# Patient Record
Sex: Male | Born: 1965 | Race: Black or African American | Hispanic: No | Smoking: Never smoker
Health system: Southern US, Community
[De-identification: ages and names within clinical notes are randomized; demographics above are authoritative.]

## PROBLEM LIST (undated history)

## (undated) DIAGNOSIS — Z789 Other specified health status: Secondary | ICD-10-CM

## (undated) HISTORY — DX: Other specified health status: Z78.9

## (undated) HISTORY — PX: NO PAST SURGERIES: SHX2092

## (undated) HISTORY — PX: OTHER SURGICAL HISTORY: SHX169

---

## 2003-01-24 ENCOUNTER — Encounter: Payer: Self-pay | Admitting: Emergency Medicine

## 2003-01-24 ENCOUNTER — Emergency Department (HOSPITAL_COMMUNITY): Admission: EM | Admit: 2003-01-24 | Discharge: 2003-01-24 | Payer: Self-pay | Admitting: Emergency Medicine

## 2003-11-03 ENCOUNTER — Emergency Department (HOSPITAL_COMMUNITY): Admission: EM | Admit: 2003-11-03 | Discharge: 2003-11-03 | Payer: Self-pay | Admitting: Emergency Medicine

## 2003-11-10 ENCOUNTER — Encounter: Admission: RE | Admit: 2003-11-10 | Discharge: 2003-11-10 | Payer: Self-pay | Admitting: Ophthalmology

## 2004-04-03 ENCOUNTER — Emergency Department (HOSPITAL_COMMUNITY): Admission: EM | Admit: 2004-04-03 | Discharge: 2004-04-04 | Payer: Self-pay | Admitting: Emergency Medicine

## 2005-09-13 ENCOUNTER — Emergency Department (HOSPITAL_COMMUNITY): Admission: EM | Admit: 2005-09-13 | Discharge: 2005-09-13 | Payer: Self-pay | Admitting: Emergency Medicine

## 2006-11-20 ENCOUNTER — Encounter (INDEPENDENT_AMBULATORY_CARE_PROVIDER_SITE_OTHER): Payer: Self-pay | Admitting: Family Medicine

## 2007-01-03 ENCOUNTER — Encounter (INDEPENDENT_AMBULATORY_CARE_PROVIDER_SITE_OTHER): Payer: Self-pay | Admitting: *Deleted

## 2007-01-03 ENCOUNTER — Ambulatory Visit: Payer: Self-pay | Admitting: Family Medicine

## 2007-01-03 DIAGNOSIS — R944 Abnormal results of kidney function studies: Secondary | ICD-10-CM | POA: Insufficient documentation

## 2007-01-03 LAB — CONVERTED CEMR LAB
Bilirubin Urine: NEGATIVE
Ketones, urine, test strip: NEGATIVE
Nitrite: NEGATIVE
Protein, U semiquant: NEGATIVE
Specific Gravity, Urine: 1.02
WBC Urine, dipstick: NEGATIVE

## 2007-01-07 ENCOUNTER — Telehealth (INDEPENDENT_AMBULATORY_CARE_PROVIDER_SITE_OTHER): Payer: Self-pay | Admitting: Family Medicine

## 2007-01-07 LAB — CONVERTED CEMR LAB
BUN: 12 mg/dL (ref 6–23)
Creatinine, Ser: 1.47 mg/dL (ref 0.40–1.50)
PSA: 0.59 ng/mL (ref 0.10–4.00)

## 2007-01-08 ENCOUNTER — Encounter (INDEPENDENT_AMBULATORY_CARE_PROVIDER_SITE_OTHER): Payer: Self-pay | Admitting: *Deleted

## 2007-01-10 ENCOUNTER — Telehealth (INDEPENDENT_AMBULATORY_CARE_PROVIDER_SITE_OTHER): Payer: Self-pay | Admitting: *Deleted

## 2007-01-11 ENCOUNTER — Ambulatory Visit: Payer: Self-pay | Admitting: Family Medicine

## 2007-01-11 ENCOUNTER — Telehealth (INDEPENDENT_AMBULATORY_CARE_PROVIDER_SITE_OTHER): Payer: Self-pay | Admitting: *Deleted

## 2007-01-11 ENCOUNTER — Encounter (INDEPENDENT_AMBULATORY_CARE_PROVIDER_SITE_OTHER): Payer: Self-pay | Admitting: *Deleted

## 2007-01-11 LAB — CONVERTED CEMR LAB
Creatinine,U: 92.1 mg/dL
Microalb, Ur: 0.3 mg/dL (ref 0.0–1.9)

## 2007-01-30 ENCOUNTER — Ambulatory Visit: Payer: Self-pay | Admitting: Family Medicine

## 2007-01-31 ENCOUNTER — Encounter (INDEPENDENT_AMBULATORY_CARE_PROVIDER_SITE_OTHER): Payer: Self-pay | Admitting: *Deleted

## 2007-01-31 LAB — CONVERTED CEMR LAB
Hemoglobin, Urine: NEGATIVE
Leukocytes, UA: NEGATIVE
Nitrite: NEGATIVE
Protein, ur: NEGATIVE mg/dL
Specific Gravity, Urine: 1.004 — ABNORMAL LOW (ref 1.005–1.03)
Urobilinogen, UA: 0.2 (ref 0.0–1.0)

## 2012-08-22 ENCOUNTER — Emergency Department (HOSPITAL_COMMUNITY)
Admission: EM | Admit: 2012-08-22 | Discharge: 2012-08-23 | Disposition: A | Discharge status: Home / Self Care | Payer: BC Managed Care – PPO | Attending: Emergency Medicine | Admitting: Emergency Medicine

## 2012-08-22 ENCOUNTER — Encounter (HOSPITAL_COMMUNITY): Payer: Self-pay | Admitting: *Deleted

## 2012-08-22 DIAGNOSIS — R5381 Other malaise: Secondary | ICD-10-CM | POA: Insufficient documentation

## 2012-08-22 DIAGNOSIS — R000 Tachycardia, unspecified: Secondary | ICD-10-CM | POA: Insufficient documentation

## 2012-08-22 DIAGNOSIS — R509 Fever, unspecified: Secondary | ICD-10-CM | POA: Insufficient documentation

## 2012-08-22 DIAGNOSIS — R21 Rash and other nonspecific skin eruption: Secondary | ICD-10-CM

## 2012-08-22 LAB — CBC WITH DIFFERENTIAL/PLATELET
Basophils Absolute: 0 10*3/uL (ref 0.0–0.1)
Lymphocytes Relative: 30 % (ref 12–46)
Lymphs Abs: 2.2 10*3/uL (ref 0.7–4.0)
Neutro Abs: 3.6 10*3/uL (ref 1.7–7.7)
Platelets: 202 10*3/uL (ref 150–400)
RBC: 5.91 MIL/uL — ABNORMAL HIGH (ref 4.22–5.81)
RDW: 15.2 % (ref 11.5–15.5)
WBC: 7.2 10*3/uL (ref 4.0–10.5)

## 2012-08-22 LAB — BASIC METABOLIC PANEL
CO2: 26 mEq/L (ref 19–32)
Chloride: 98 mEq/L (ref 96–112)
Potassium: 4.3 mEq/L (ref 3.5–5.1)
Sodium: 135 mEq/L (ref 135–145)

## 2012-08-22 LAB — SEDIMENTATION RATE: Sed Rate: 2 mm/hr (ref 0–16)

## 2012-08-22 MED ORDER — SODIUM CHLORIDE 0.9 % IV BOLUS (SEPSIS)
1000.0000 mL | Freq: Once | INTRAVENOUS | Status: AC
  Administered 2012-08-22: 1000 mL via INTRAVENOUS

## 2012-08-22 MED ORDER — METHYLPREDNISOLONE SODIUM SUCC 125 MG IJ SOLR
125.0000 mg | Freq: Once | INTRAMUSCULAR | Status: AC
  Administered 2012-08-22: 125 mg via INTRAVENOUS
  Filled 2012-08-22: qty 2

## 2012-08-22 MED ORDER — SODIUM CHLORIDE 0.9 % IV BOLUS (SEPSIS)
1000.0000 mL | Freq: Once | INTRAVENOUS | Status: AC
Start: 2012-08-22 — End: 2012-08-23
  Administered 2012-08-22: 1000 mL via INTRAVENOUS

## 2012-08-22 MED ORDER — DIPHENHYDRAMINE HCL 50 MG/ML IJ SOLN
25.0000 mg | Freq: Once | INTRAMUSCULAR | Status: AC
  Administered 2012-08-22: 25 mg via INTRAVENOUS
  Filled 2012-08-22: qty 1

## 2012-08-22 NOTE — ED Provider Notes (Signed)
History     CSN: [id number]  Arrival date & time 08/22/12  2210   First MD Initiated Contact with Patient 08/22/12 2249      Chief Complaint  Patient presents with  . Rash   HPI  History provided by the patient and wife. Patient is a 47 year old African American male with no significant PMH who presents with complaints of painful rash. Patient states she first began feeling symptoms of fever and chills on Monday. Symptoms gradually progressed on Tuesday he took ibuprofen and over-the-counter cough and cold medicines for his symptoms. On Wednesday he began to develop slight rash which progressed into painful burning sensation primarily over the palms of his hands and soles of feet. He also complains of some rash to the scalp and body. He denies any new lotions, detergents or clothing superior patient has not had any recent foreign travel. He did travel to the [place] on a cruise last October. Patient denies any other sick contacts. No new foods. He denies any similar symptoms previously. Denies any itching or rash.    History reviewed. No pertinent past medical history.  History reviewed. No pertinent past surgical history.  Family History  Problem Relation Age of Onset  . Stroke Mother   . Stroke Father     History  Substance Use Topics  . Smoking status: Never Smoker   . Smokeless tobacco: Not on file  . Alcohol Use: No      Review of Systems  Constitutional: Positive for fever, chills and fatigue.  HENT: Negative for congestion and rhinorrhea.   Respiratory: Negative for cough.   Cardiovascular: Negative for chest pain.  Gastrointestinal: Negative for nausea, vomiting, abdominal pain and diarrhea.  Musculoskeletal: Negative for myalgias.  Skin: Positive for rash.  Neurological: Positive for headaches.  All other systems reviewed and are negative.    Allergies  Review of patient's allergies indicates no known allergies.  Home Medications  No current outpatient  prescriptions on file.  BP 132/91  Pulse 114  Temp(Src) 99.1 F (37.3 C) (Oral)  SpO2 99%  Physical Exam  Nursing note and vitals reviewed. Constitutional: He is oriented to person, place, and time. He appears well-developed and well-nourished. No distress.  HENT:  Head: Normocephalic and atraumatic.  Mouth/Throat: Oropharynx is clear and moist.  Lips, tongue and oral close normal without lesions or sores. No significant erythema oral pharynx. Tonsils appear normal without exudate.  Eyes: Conjunctivae and EOM are normal. Pupils are equal, round, and reactive to light.  Neck: Normal range of motion. Neck supple.  No meningeal signs.  Cardiovascular: Regular rhythm.  Tachycardia present.   No murmur heard. Pulmonary/Chest: Effort normal and breath sounds normal. No stridor. No respiratory distress. He has no wheezes. He has no rales.  Abdominal: Soft. There is no tenderness. There is no rebound and no guarding.  Musculoskeletal: Normal range of motion. He exhibits no tenderness.  Lymphadenopathy:    He has no cervical adenopathy.  Neurological: He is alert and oriented to person, place, and time.  Skin: Skin is warm and dry. He is not diaphoretic. There is erythema.  Patient with blanchable macular erythematous rash most notable for the palms and soles of feet. Patient reports sensitive and tender skin to palpation.  Psychiatric: He has a normal mood and affect. His behavior is normal.    ED Course  Procedures   Results for orders placed during the hospital encounter of 08/22/12  RAPID STREP SCREEN      Result  Value Range   Streptococcus, Group A Screen (Direct) NEGATIVE  NEGATIVE  CBC WITH DIFFERENTIAL      Result Value Range   WBC 7.2  4.0 - 10.5 K/uL   RBC 5.91 (*) 4.22 - 5.81 MIL/uL   Hemoglobin 15.9  13.0 - 17.0 g/dL   HCT 45.4  09.8 - 11.9 %   MCV 80.0  78.0 - 100.0 fL   MCH 26.9  26.0 - 34.0 pg   MCHC 33.6  30.0 - 36.0 g/dL   RDW 14.7  82.9 - 56.2 %   Platelets  202  150 - 400 K/uL   Neutrophils Relative 50  43 - 77 %   Neutro Abs 3.6  1.7 - 7.7 K/uL   Lymphocytes Relative 30  12 - 46 %   Lymphs Abs 2.2  0.7 - 4.0 K/uL   Monocytes Relative 17 (*) 3 - 12 %   Monocytes Absolute 1.2 (*) 0.1 - 1.0 K/uL   Eosinophils Relative 2  0 - 5 %   Eosinophils Absolute 0.1  0.0 - 0.7 K/uL   Basophils Relative 0  0 - 1 %   Basophils Absolute 0.0  0.0 - 0.1 K/uL  BASIC METABOLIC PANEL      Result Value Range   Sodium 135  135 - 145 mEq/L   Potassium 4.3  3.5 - 5.1 mEq/L   Chloride 98  96 - 112 mEq/L   CO2 26  19 - 32 mEq/L   Glucose, Bld 98  70 - 99 mg/dL   BUN 18  6 - 23 mg/dL   Creatinine, Ser 1.30  0.50 - 1.35 mg/dL   Calcium 9.5  8.4 - 86.5 mg/dL   GFR calc non Af Amer 63 (*) >90 mL/min   GFR calc Af Amer 73 (*) >90 mL/min  SEDIMENTATION RATE      Result Value Range   Sed Rate 2  0 - 16 mm/hr  C-REACTIVE PROTEIN      Result Value Range   CRP 6.7 (*) <0.60 mg/dL  RPR      Result Value Range   RPR NON REACTIVE  NON REACTIVE  RAPID HIV SCREEN ([facility])      Result Value Range   SUDS Rapid HIV Screen NON REACTIVE  NON REACTIVE         1. Fever   2. Rash       MDM  10:40 PM patient seen and evaluated. She appears well in no acute distress. Patient's appears fairly toxic.  Patient also discussed with attending physician, Dr. Dierdre Highman. We will also had an HIV testing. I discussed with patient and he agrees.  Labs continue to be unremarkable. CRP slightly elevated but nonspecific. Patient continues to feel well and improved. At this time we'll discharge home with strict return precautions and recommendations for reevaluation next 24-48 hours.    Angus Seller, PA 08/23/12 0416  Angus Seller, PA 08/23/12 [phone]

## 2012-08-22 NOTE — ED Notes (Addendum)
Pt reports rash all over his body that started on Monday.  Pt reports painful burning that feels like needles all over

## 2012-08-23 ENCOUNTER — Telehealth (HOSPITAL_COMMUNITY): Payer: Self-pay | Admitting: Emergency Medicine

## 2012-08-23 LAB — RAPID HIV SCREEN (WH-MAU): Rapid HIV Screen: NONREACTIVE

## 2012-08-23 LAB — RAPID STREP SCREEN (MED CTR MEBANE ONLY): Streptococcus, Group A Screen (Direct): NEGATIVE

## 2012-08-23 MED ORDER — KETOROLAC TROMETHAMINE 30 MG/ML IJ SOLN
30.0000 mg | Freq: Once | INTRAMUSCULAR | Status: AC
  Administered 2012-08-23: 30 mg via INTRAVENOUS
  Filled 2012-08-23: qty 1

## 2012-08-23 MED ORDER — DIPHENHYDRAMINE HCL 50 MG/ML IJ SOLN
25.0000 mg | Freq: Once | INTRAMUSCULAR | Status: AC
  Administered 2012-08-23: 25 mg via INTRAVENOUS
  Filled 2012-08-23: qty 1

## 2012-08-23 MED ORDER — DOXYCYCLINE HYCLATE 100 MG PO CAPS: 100.0000 mg | ORAL_CAPSULE | Freq: Two times a day (BID) | ORAL | Status: DC

## 2012-08-23 NOTE — ED Provider Notes (Signed)
Medical screening examination/treatment/procedure(s) were performed by non-physician practitioner and as supervising physician I was immediately available for consultation/collaboration.  Sunnie Nielsen, MD 08/23/12 [phone]

## 2012-08-23 NOTE — ED Notes (Signed)
Pt called to see if there was anything else he could do for rash that he was seen @ [facility] for 2/20.  Pt is taking prescribed abx and Benedryl.  Kathline Magic PA consulted ok to call in Rx for "Prednisone 20 mg tab, take 1 every day x 5 days QS, no refills"  Also instruct pt to take Ibuprofen for pain, inflammation.  Pt informed and Rx for prednisone called to [facility]. [phone].

## 2012-08-24 ENCOUNTER — Telehealth (HOSPITAL_COMMUNITY): Payer: Self-pay | Admitting: Emergency Medicine

## 2016-10-18 ENCOUNTER — Observation Stay (HOSPITAL_COMMUNITY): Payer: BLUE CROSS/BLUE SHIELD

## 2016-10-18 ENCOUNTER — Observation Stay (HOSPITAL_BASED_OUTPATIENT_CLINIC_OR_DEPARTMENT_OTHER): Payer: BLUE CROSS/BLUE SHIELD

## 2016-10-18 ENCOUNTER — Emergency Department (HOSPITAL_COMMUNITY): Payer: BLUE CROSS/BLUE SHIELD

## 2016-10-18 ENCOUNTER — Encounter (HOSPITAL_COMMUNITY): Payer: Self-pay | Admitting: Emergency Medicine

## 2016-10-18 ENCOUNTER — Observation Stay (HOSPITAL_COMMUNITY)
Admission: EM | Admit: 2016-10-18 | Discharge: 2016-10-18 | Disposition: A | Discharge status: Home / Self Care | Payer: BLUE CROSS/BLUE SHIELD | Attending: Internal Medicine | Admitting: Internal Medicine

## 2016-10-18 DIAGNOSIS — R29898 Other symptoms and signs involving the musculoskeletal system: Secondary | ICD-10-CM | POA: Diagnosis present

## 2016-10-18 DIAGNOSIS — G459 Transient cerebral ischemic attack, unspecified: Secondary | ICD-10-CM | POA: Diagnosis not present

## 2016-10-18 DIAGNOSIS — G458 Other transient cerebral ischemic attacks and related syndromes: Secondary | ICD-10-CM

## 2016-10-18 DIAGNOSIS — I10 Essential (primary) hypertension: Secondary | ICD-10-CM | POA: Diagnosis not present

## 2016-10-18 DIAGNOSIS — R200 Anesthesia of skin: Secondary | ICD-10-CM | POA: Diagnosis not present

## 2016-10-18 DIAGNOSIS — Z823 Family history of stroke: Secondary | ICD-10-CM | POA: Diagnosis not present

## 2016-10-18 DIAGNOSIS — N182 Chronic kidney disease, stage 2 (mild): Secondary | ICD-10-CM | POA: Diagnosis present

## 2016-10-18 LAB — CBC WITH DIFFERENTIAL/PLATELET
Basophils Absolute: 0.1 10*3/uL (ref 0.0–0.1)
Basophils Relative: 1 %
Eosinophils Absolute: 0.3 10*3/uL (ref 0.0–0.7)
Eosinophils Relative: 5 %
HCT: 44.9 % (ref 39.0–52.0)
HEMOGLOBIN: 14.5 g/dL (ref 13.0–17.0)
LYMPHS ABS: 4.5 10*3/uL — AB (ref 0.7–4.0)
Lymphocytes Relative: 62 %
MCH: 26.3 pg (ref 26.0–34.0)
MCHC: 32.3 g/dL (ref 30.0–36.0)
MCV: 81.3 fL (ref 78.0–100.0)
Monocytes Absolute: 0.5 10*3/uL (ref 0.1–1.0)
Monocytes Relative: 7 %
Neutro Abs: 1.7 10*3/uL (ref 1.7–7.7)
Neutrophils Relative %: 25 %
Platelets: 210 10*3/uL (ref 150–400)
RBC: 5.52 MIL/uL (ref 4.22–5.81)
RDW: 16 % — ABNORMAL HIGH (ref 11.5–15.5)
WBC: 7 10*3/uL (ref 4.0–10.5)

## 2016-10-18 LAB — PROTIME-INR
INR: 1.04
Prothrombin Time: 13.6 seconds (ref 11.4–15.2)

## 2016-10-18 LAB — URINALYSIS, ROUTINE W REFLEX MICROSCOPIC
Bilirubin Urine: NEGATIVE
GLUCOSE, UA: NEGATIVE mg/dL
Hgb urine dipstick: NEGATIVE
KETONES UR: NEGATIVE mg/dL
LEUKOCYTES UA: NEGATIVE
Nitrite: NEGATIVE
PH: 6 (ref 5.0–8.0)
Protein, ur: NEGATIVE mg/dL
Specific Gravity, Urine: 1.014 (ref 1.005–1.030)

## 2016-10-18 LAB — ECHOCARDIOGRAM COMPLETE
Height: 74 in
WEIGHTICAEL: 3632 [oz_av]

## 2016-10-18 LAB — COMPREHENSIVE METABOLIC PANEL
ALT: 27 U/L (ref 17–63)
AST: 40 U/L (ref 15–41)
Albumin: 4.1 g/dL (ref 3.5–5.0)
Alkaline Phosphatase: 72 U/L (ref 38–126)
Anion gap: 9 (ref 5–15)
BILIRUBIN TOTAL: 0.7 mg/dL (ref 0.3–1.2)
BUN: 13 mg/dL (ref 6–20)
CALCIUM: 9.2 mg/dL (ref 8.9–10.3)
CO2: 26 mmol/L (ref 22–32)
CREATININE: 1.41 mg/dL — AB (ref 0.61–1.24)
Chloride: 103 mmol/L (ref 101–111)
GFR, EST NON AFRICAN AMERICAN: 57 mL/min — AB (ref >60)
Glucose, Bld: 104 mg/dL — ABNORMAL HIGH (ref 65–99)
Potassium: 3.8 mmol/L (ref 3.5–5.1)
Sodium: 138 mmol/L (ref 135–145)
TOTAL PROTEIN: 7.2 g/dL (ref 6.5–8.1)

## 2016-10-18 LAB — I-STAT CHEM 8, ED
BUN: 14 mg/dL (ref 6–20)
CHLORIDE: 104 mmol/L (ref 101–111)
Calcium, Ion: 1.06 mmol/L — ABNORMAL LOW (ref 1.15–1.40)
Creatinine, Ser: 1.3 mg/dL — ABNORMAL HIGH (ref 0.61–1.24)
Glucose, Bld: 105 mg/dL — ABNORMAL HIGH (ref 65–99)
HCT: 50 % (ref 39.0–52.0)
HEMOGLOBIN: 17 g/dL (ref 13.0–17.0)
POTASSIUM: 3.8 mmol/L (ref 3.5–5.1)
SODIUM: 140 mmol/L (ref 135–145)
TCO2: 26 mmol/L (ref 0–100)

## 2016-10-18 LAB — RAPID URINE DRUG SCREEN, HOSP PERFORMED
Amphetamines: NOT DETECTED
Barbiturates: NOT DETECTED
Benzodiazepines: NOT DETECTED
COCAINE: NOT DETECTED
OPIATES: NOT DETECTED
TETRAHYDROCANNABINOL: NOT DETECTED

## 2016-10-18 LAB — DIFFERENTIAL
BASOS PCT: 1 %
Basophils Absolute: 0.1 10*3/uL (ref 0.0–0.1)
Eosinophils Absolute: 0.2 10*3/uL (ref 0.0–0.7)
Eosinophils Relative: 3 %
Lymphocytes Relative: 51 %
Lymphs Abs: 3.2 10*3/uL (ref 0.7–4.0)
MONO ABS: 0.4 10*3/uL (ref 0.1–1.0)
Monocytes Relative: 7 %
NEUTROS PCT: 38 %
Neutro Abs: 2.4 10*3/uL (ref 1.7–7.7)

## 2016-10-18 LAB — APTT: APTT: 30 s (ref 24–36)

## 2016-10-18 LAB — I-STAT TROPONIN, ED: Troponin i, poc: 0 ng/mL (ref 0.00–0.08)

## 2016-10-18 LAB — ETHANOL

## 2016-10-18 MED ORDER — ACETAMINOPHEN 325 MG PO TABS: 650.0000 mg | ORAL_TABLET | ORAL | Status: DC | PRN

## 2016-10-18 MED ORDER — ASPIRIN EC 81 MG PO TBEC: 81.0000 mg | DELAYED_RELEASE_TABLET | Freq: Every day | ORAL | 1 refills | Status: DC

## 2016-10-18 MED ORDER — ACETAMINOPHEN 160 MG/5ML PO SOLN: 650.0000 mg | ORAL | Status: DC | PRN

## 2016-10-18 MED ORDER — SODIUM CHLORIDE 0.9 % IV SOLN
INTRAVENOUS | Status: AC
  Administered 2016-10-18: 07:00:00 via INTRAVENOUS

## 2016-10-18 MED ORDER — ENOXAPARIN SODIUM 40 MG/0.4ML ~~LOC~~ SOLN
40.0000 mg | Freq: Every day | SUBCUTANEOUS | Status: DC
  Filled 2016-10-18: qty 0.4

## 2016-10-18 MED ORDER — LABETALOL HCL 5 MG/ML IV SOLN: 5.0000 mg | INTRAVENOUS | Status: DC | PRN

## 2016-10-18 MED ORDER — ASPIRIN 300 MG RE SUPP: 300.0000 mg | Freq: Every day | RECTAL | Status: DC

## 2016-10-18 MED ORDER — ACETAMINOPHEN 650 MG RE SUPP: 650.0000 mg | RECTAL | Status: DC | PRN

## 2016-10-18 MED ORDER — ASPIRIN 325 MG PO TABS: 325.0000 mg | ORAL_TABLET | Freq: Every day | ORAL | Status: DC

## 2016-10-18 MED ORDER — STROKE: EARLY STAGES OF RECOVERY BOOK
Freq: Once | Status: DC
  Filled 2016-10-18: qty 1

## 2016-10-18 MED ORDER — SENNOSIDES-DOCUSATE SODIUM 8.6-50 MG PO TABS
1.0000 | ORAL_TABLET | Freq: Every evening | ORAL | Status: DC | PRN
  Filled 2016-10-18: qty 1

## 2016-10-18 NOTE — Care Management Note (Signed)
Case Management Note  Patient Details  Name: CORTLIN MARANO MRN: [id number] Date of Birth: 05/19/1966  Subjective/Objective:   Pt in with r/o CVA. He is from home with his wife.                Action/Plan: CM met with the patient d/t no insurance listed and no PCP. Pt states he goes to the [facility] in [place]. Pt not sure he wants to continue with the clinic. CM provided him the number for the [facility] in case he decides to change MDs.  Pt able to provide BCBS card. Card copied and faxed to [employer].  Pt d/cing home with self care. No further needs per CM.   Expected Discharge Date:  10/18/16               Expected Discharge Plan:  Home/Self Care  In-House Referral:     Discharge planning Services     Post Acute Care Choice:    Choice offered to:     DME Arranged:    DME Agency:     HH Arranged:    HH Agency:     Status of Service:  Completed, signed off  If discussed at [employer] of Stay Meetings, dates discussed:    Additional Comments:  Pollie Friar, RN 10/18/2016, 4:39 PM

## 2016-10-18 NOTE — Progress Notes (Signed)
Discharge instructions reviewed with patient and with spouse.  These included the following:  Medications, prescriptions, when to call the MD, signs and sx. of stroke, f/u appointments, activity, and sign-up for 'My Chart'.  Patient and wife receptive to education.  Comprehension was ascertained via "Teach-back" method.  Patient discharged via wheelchair to private residence with wife.  Escorted to exit by nurse tech.

## 2016-10-18 NOTE — Progress Notes (Signed)
  Echocardiogram 2D Echocardiogram has been performed.  Dale Murillo 10/18/2016, 4:16 PM

## 2016-10-18 NOTE — Progress Notes (Signed)
VASCULAR LAB PRELIMINARY  PRELIMINARY  PRELIMINARY  PRELIMINARY  Carotid duplex completed.    Preliminary report:  Bilateral - No evidence of extracranial ICA stenosis. Verterbral artery flow is antegrade.  Dale Murillo, RVS 10/18/2016, 12:37 PM

## 2016-10-18 NOTE — ED Notes (Signed)
RN requested Oni MD to update pt on plan of care Re: admission

## 2016-10-18 NOTE — ED Notes (Signed)
Pt traveling to MRI at this time. Can be transported to the floor when finished.

## 2016-10-18 NOTE — ED Notes (Signed)
ED Provider at bedside. 

## 2016-10-18 NOTE — Consult Note (Signed)
Admission H&P    Chief Complaint: Transient weakness and numbness of left hand.  HPI: Dale Murillo is an 51 y.o. male with no previously documented medical disorder presenting following an episode of numbness and weakness involving his left hand. He had numbness involving the first 2 digits of his hand primarily and was not able to take a fist with his left hand. Patient is left-handed. Symptoms lasted about 30 minutes then resolved. He has no previous history of stroke nor TIA. Blood pressure was noted to be elevated in the ED. CT scan of his head showed no acute intracranial abnormality. There was no facial droop and no dysarthria reported.  LSN:  tPA Given: No: Deficits resolved mRankin:  History reviewed. No pertinent past medical history.  History reviewed. No pertinent surgical history.  Family History  Problem Relation Age of Onset  . Stroke Mother   . Stroke Father    Social History:  reports that he has never smoked. He has never used smokeless tobacco. He reports that he does not drink alcohol or use drugs.  Allergies: No Known Allergies  Medications: None.  ROS: History obtained from the patient  General ROS: negative for - chills, fatigue, fever, night sweats, weight gain or weight loss Psychological ROS: negative for - behavioral disorder, hallucinations, memory difficulties, mood swings or suicidal ideation Ophthalmic ROS: negative for - blurry vision, double vision, eye pain or loss of vision ENT ROS: negative for - epistaxis, nasal discharge, oral lesions, sore throat, tinnitus or vertigo Allergy and Immunology ROS: negative for - hives or itchy/watery eyes Hematological and Lymphatic ROS: negative for - bleeding problems, bruising or swollen lymph nodes Endocrine ROS: negative for - galactorrhea, hair pattern changes, polydipsia/polyuria or temperature intolerance Respiratory ROS: negative for - cough, hemoptysis, shortness of breath or wheezing Cardiovascular  ROS: negative for - chest pain, dyspnea on exertion, edema or irregular heartbeat Gastrointestinal ROS: negative for - abdominal pain, diarrhea, hematemesis, nausea/vomiting or stool incontinence Genito-Urinary ROS: negative for - dysuria, hematuria, incontinence or urinary frequency/urgency Musculoskeletal ROS: negative for - joint swelling or muscular weakness Neurological ROS: as noted in HPI Dermatological ROS: negative for rash and skin lesion changes  Physical Examination: Blood pressure (!) 143/91, pulse (!) 55, temperature 98.6 F (37 C), temperature source Oral, resp. rate 13, height 6' 2"  (1.88 m), weight 103 kg (227 lb), SpO2 96 %.  HEENT-  Normocephalic, no lesions, without obvious abnormality.  Normal external eye and conjunctiva.  Normal TM's bilaterally.  Normal auditory canals and external ears. Normal external nose, mucus membranes and septum.  Normal pharynx. Neck supple with no masses, nodes, nodules or enlargement. Cardiovascular - regular rate and rhythm, S1, S2 normal, no murmur, click, rub or gallop Lungs - chest clear, no wheezing, rales, normal symmetric air entry Abdomen - soft, non-tender; bowel sounds normal; no masses,  no organomegaly Extremities - no joint deformities, effusion, or inflammation and no edema  Neurologic Examination: Mental Status: Alert, oriented, thought content appropriate.  Speech fluent without evidence of aphasia. Able to follow commands without difficulty. Cranial Nerves: II-Visual fields were normal. III/IV/VI-Pupils were equal and reacted normally to light. Extraocular movements were full and conjugate.    V/VII-no facial numbness and no facial weakness. VIII-normal. X-normal speech and symmetrical palatal movement. XI: trapezius strength/neck flexion strength normal bilaterally XII-midline tongue extension with normal strength. Motor: 5/5 bilaterally with normal tone and bulk Sensory: Normal throughout. Deep Tendon Reflexes: 1+  and symmetric. Plantars: Flexor bilaterally Cerebellar: Normal finger-to-nose  testing. Carotid auscultation: Normal  Results for orders placed or performed during the hospital encounter of 10/18/16 (from the past 48 hour(s))  CBC with Differential     Status: Abnormal   Collection Time: 10/18/16  1:45 AM  Result Value Ref Range   WBC 7.0 4.0 - 10.5 K/uL   RBC 5.52 4.22 - 5.81 MIL/uL   Hemoglobin 14.5 13.0 - 17.0 g/dL   HCT 44.9 39.0 - 52.0 %   MCV 81.3 78.0 - 100.0 fL   MCH 26.3 26.0 - 34.0 pg   MCHC 32.3 30.0 - 36.0 g/dL   RDW 16.0 (H) 11.5 - 15.5 %   Platelets 210 150 - 400 K/uL   Neutrophils Relative % 25 %   Neutro Abs 1.7 1.7 - 7.7 K/uL   Lymphocytes Relative 62 %   Lymphs Abs 4.5 (H) 0.7 - 4.0 K/uL   Monocytes Relative 7 %   Monocytes Absolute 0.5 0.1 - 1.0 K/uL   Eosinophils Relative 5 %   Eosinophils Absolute 0.3 0.0 - 0.7 K/uL   Basophils Relative 1 %   Basophils Absolute 0.1 0.0 - 0.1 K/uL  Comprehensive metabolic panel     Status: Abnormal   Collection Time: 10/18/16  1:45 AM  Result Value Ref Range   Sodium 138 135 - 145 mmol/L   Potassium 3.8 3.5 - 5.1 mmol/L   Chloride 103 101 - 111 mmol/L   CO2 26 22 - 32 mmol/L   Glucose, Bld 104 (H) 65 - 99 mg/dL   BUN 13 6 - 20 mg/dL   Creatinine, Ser 1.41 (H) 0.61 - 1.24 mg/dL   Calcium 9.2 8.9 - 10.3 mg/dL   Total Protein 7.2 6.5 - 8.1 g/dL   Albumin 4.1 3.5 - 5.0 g/dL   AST 40 15 - 41 U/L   ALT 27 17 - 63 U/L   Alkaline Phosphatase 72 38 - 126 U/L   Total Bilirubin 0.7 0.3 - 1.2 mg/dL   GFR calc non Af Amer 57 (L) >60 mL/min   GFR calc Af Amer >60 >60 mL/min    Comment: (NOTE) The eGFR has been calculated using the CKD EPI equation. This calculation has not been validated in all clinical situations. eGFR's persistently <60 mL/min signify possible Chronic Kidney Disease.    Anion gap 9 5 - 15  Ethanol     Status: None   Collection Time: 10/18/16  4:47 AM  Result Value Ref Range   Alcohol, Ethyl (B) <5 <5  mg/dL    Comment:        LOWEST DETECTABLE LIMIT FOR SERUM ALCOHOL IS 5 mg/dL FOR MEDICAL PURPOSES ONLY   Protime-INR     Status: None   Collection Time: 10/18/16  4:47 AM  Result Value Ref Range   Prothrombin Time 13.6 11.4 - 15.2 seconds   INR 1.04   APTT     Status: None   Collection Time: 10/18/16  4:47 AM  Result Value Ref Range   aPTT 30 24 - 36 seconds  Differential     Status: None   Collection Time: 10/18/16  4:47 AM  Result Value Ref Range   Neutrophils Relative % 38 %   Neutro Abs 2.4 1.7 - 7.7 K/uL   Lymphocytes Relative 51 %   Lymphs Abs 3.2 0.7 - 4.0 K/uL   Monocytes Relative 7 %   Monocytes Absolute 0.4 0.1 - 1.0 K/uL   Eosinophils Relative 3 %   Eosinophils Absolute 0.2 0.0 - 0.7  K/uL   Basophils Relative 1 %   Basophils Absolute 0.1 0.0 - 0.1 K/uL  I-stat troponin, ED (not at [facility], [facility])     Status: None   Collection Time: 10/18/16  5:01 AM  Result Value Ref Range   Troponin i, poc 0.00 0.00 - 0.08 ng/mL   Comment 3            Comment: Due to the release kinetics of cTnI, a negative result within the first hours of the onset of symptoms does not rule out myocardial infarction with certainty. If myocardial infarction is still suspected, repeat the test at appropriate intervals.   I-Stat Chem 8, ED  (not at [facility], [facility])     Status: Abnormal   Collection Time: 10/18/16  5:04 AM  Result Value Ref Range   Sodium 140 135 - 145 mmol/L   Potassium 3.8 3.5 - 5.1 mmol/L   Chloride 104 101 - 111 mmol/L   BUN 14 6 - 20 mg/dL   Creatinine, Ser 1.30 (H) 0.61 - 1.24 mg/dL   Glucose, Bld 105 (H) 65 - 99 mg/dL   Calcium, Ion 1.06 (L) 1.15 - 1.40 mmol/L   TCO2 26 0 - 100 mmol/L   Hemoglobin 17.0 13.0 - 17.0 g/dL   HCT 50.0 39.0 - 52.0 %   Ct Head Wo Contrast  Result Date: 10/18/2016 CLINICAL DATA:  Left hand weakness, now resolved. Family history of stroke. EXAM: CT HEAD WITHOUT CONTRAST TECHNIQUE: Contiguous axial images were obtained from the base of the skull  through the vertex without intravenous contrast. COMPARISON:  11/03/2003 FINDINGS: Brain: No evidence of acute infarction, hemorrhage, hydrocephalus, extra-axial collection or mass lesion/mass effect. Vascular: No hyperdense vessel or unexpected calcification. Skull: Normal. Negative for fracture or focal lesion. Sinuses/Orbits: Mucosal thickening in the paranasal sinuses. No acute air-fluid levels. Mastoid air cells are not opacified. Other: None. IMPRESSION: No acute intracranial abnormalities. Electronically Signed   By: Lucienne Capers M.D.   On: 10/18/2016 05:26    Assessment: 51 y.o. male with risk factors for stroke, including hypertension family history, presenting with probable transient ischemic attack. However, a small subcortical right MCA territory ischemic infarction cannot be ruled out at this point.  Stroke Risk Factors - family history and hypertension  Plan: 1. HgbA1c, fasting lipid panel 2. MRI, MRA  of the brain without contrast 3. PT consult, OT consult, Speech consult 4. Echocardiogram 5. Carotid dopplers 6. Prophylactic therapy-Antiplatelet med: Aspirin  7. Risk factor modification 8. Telemetry monitoring  C.R. Nicole Kindred, MD [facility] [phone]  10/18/2016, 6:06 AM

## 2016-10-18 NOTE — ED Notes (Signed)
MD requested RN take pt to restroom but did not inform RN that he wanted urine specimen therefore no specimen collected. MD notified, per [facility] doesn't need it at this time. Will attempt to collect later.

## 2016-10-18 NOTE — Evaluation (Signed)
Physical Therapy Evaluation Patient Details Name: Dale Murillo MRN: [id number] DOB: 1965/08/14 Today's Date: 10/18/2016   History of Present Illness  HPI: Dale Murillo is a 51 y.o. male with no significant past medical history, now presenting to the emergency department with transient numbness and weakness in the left hand.   Clinical Impression   Patient evaluated by Physical Therapy with no further acute PT needs identified. All education has been completed and the patient has no further questions. Discussed signs and symptoms of CVA and risk factor modification;  See below for any follow-up Physical Therapy or equipment needs. PT is signing off. Thank you for this referral.     Follow Up Recommendations No PT follow up    Equipment Recommendations  None recommended by PT    Recommendations for Other Services       Precautions / Restrictions Precautions Precautions: None      Mobility  Bed Mobility Overal bed mobility: Independent                Transfers Overall transfer level: Independent                  Ambulation/Gait Ambulation/Gait assistance: Independent Ambulation Distance (Feet): 1000 Feet (at least) Assistive device: None Gait Pattern/deviations: WFL(Within Functional Limits)   Gait velocity interpretation: at or above normal speed for age/gender    Stairs Stairs: Yes Stairs assistance: Independent Stair Management: No rails;Alternating pattern;Forwards (Including skipping steps) Number of Stairs: 18    Wheelchair Mobility    Modified Rankin (Stroke Patients Only)       Balance Overall balance assessment: Independent                                           Pertinent Vitals/Pain Pain Assessment: No/denies pain    Home Living Family/patient expects to be discharged to:: Private residence Living Arrangements: Spouse/significant other Available Help at Discharge: Family   Home Access: Stairs to  enter Entrance Stairs-Rails: Doctor, general practice of Steps: Flight (x3) Home Layout: One level Home Equipment: None      Prior Function Level of Independence: Independent               Hand Dominance        Extremity/Trunk Assessment   Upper Extremity Assessment Upper Extremity Assessment: Defer to OT evaluation    Lower Extremity Assessment Lower Extremity Assessment: Overall WFL for tasks assessed    Cervical / Trunk Assessment Cervical / Trunk Assessment: Normal  Communication   Communication: No difficulties  Cognition Arousal/Alertness: Awake/alert Behavior During Therapy: WFL for tasks assessed/performed Overall Cognitive Status: Within Functional Limits for tasks assessed                                        General Comments      Exercises     Assessment/Plan    PT Assessment Patent does not need any further PT services  PT Problem List         PT Treatment Interventions      PT Goals (Current goals can be found in the Care Plan section)  Acute Rehab PT Goals Patient Stated Goal: Hopefully home soon; hopes to decr his risk of CVA PT Goal Formulation: All assessment and education complete, DC therapy  Frequency     Barriers to discharge        Co-evaluation               End of Session   Activity Tolerance: Patient tolerated treatment well Patient left: Other (comment) (managing independently in room) Nurse Communication: Mobility status PT Visit Diagnosis: Hemiplegia and hemiparesis Hemiplegia - Right/Left: Left Hemiplegia - dominant/non-dominant: Non-dominant Hemiplegia - caused by: Unspecified (TIA)    Time: 4098-1191 PT Time Calculation (min) (ACUTE ONLY): 22 min   Charges:   PT Evaluation $PT Eval Low Complexity: 1 Procedure     PT G Codes:   PT G-Codes **NOT FOR INPATIENT CLASS** Functional Assessment Tool Used: Clinical judgement Functional Limitation: Mobility: Walking and  moving around Mobility: Walking and Moving Around Current Status (Y7829): 0 percent impaired, limited or restricted Mobility: Walking and Moving Around Goal Status (F6213): 0 percent impaired, limited or restricted Mobility: Walking and Moving Around Discharge Status [phone]): 0 percent impaired, limited or restricted    Van Clines, PT  Acute Rehabilitation Services Pager [phone] Office [phone]   Levi Aland 10/18/2016, 12:28 PM

## 2016-10-18 NOTE — ED Triage Notes (Signed)
Patient reports intermittent left hand tingling this evening , denies injury , equal strong grips with no arm drift , speech clear/ no facial asymmetry .

## 2016-10-18 NOTE — Progress Notes (Signed)
OT Cancellation Note and Discharge  Patient Details Name: Dale Murillo MRN: [id number] DOB: 1966/02/15   Cancelled Treatment:    Reason Eval/Treat Not Completed: OT screened, no needs identified, will sign off. Spoke with Pt and wife who state the Pt is at baseline, L hand with full functioning back. Thank you for this referral.   Emelda Fear 10/18/2016, 3:43 PM  Sherryl Manges OTR/L [phone]

## 2016-10-18 NOTE — Discharge Instructions (Signed)
Ischemic Stroke An ischemic stroke is the sudden death of brain tissue. Blood carries oxygen to all areas of the body. This type of stroke happens when your blood does not flow to your brain like normal. Your brain cannot get the oxygen it needs. This is an emergency. It must be treated right away. Symptoms of a stroke usually happen all of a sudden. You may notice them when you wake up. They can include:  Weakness or loss of feeling in your face, arm, or leg. This often happens on one side of the body.  Trouble walking.  Trouble moving your arms or legs.  Loss of balance or coordination.  Feeling confused.  Trouble talking or understanding what people are saying.  Slurred speech.  Trouble seeing.  Seeing two of one object (double vision).  Feeling dizzy.  Feeling sick to your stomach (nauseous) and throwing up (vomiting).  A very bad headache for no reason. Get help as soon as any of these problems start. This is important. Some treatments work better if they are given right away. These include:  Aspirin.  Medicines to control blood pressure.  A shot (injection) of medicine to break up the blood clot.  Treatments given in the blood vessel (artery) to take out the clot or break it up. Other treatments may include:  Oxygen.  Fluids given through an IV tube.  Medicines to thin out your blood.  Procedures to help your blood flow better. What increases the risk? Certain things may make you more likely to have a stroke. Some of these are things that you can change, such as:  Being very overweight (obesity).  Smoking.  Taking birth control pills.  Not being active.  Drinking too much alcohol.  Using drugs. Other risk factors include:  High blood pressure.  High cholesterol.  Diabetes.  Heart disease.  Being [place] American, Native [address], Hispanic, or [place] Native.  Being over age 57.  Family history of stroke.  Having had blood clots,  stroke, or warning stroke (transient ischemic attack, TIA) in the past.  Sickle cell disease.  Being a woman with a history of high blood pressure in pregnancy (preeclampsia).  Migraine headache.  Sleep apnea.  Having an irregular heartbeat (atrial fibrillation).  Long-term (chronic) diseases that cause soreness and swelling (inflammation).  Disorders that affect how your blood clots. Follow these instructions at home: Medicines   Take over-the-counter and prescription medicines only as told by your doctor.  If you were told to take aspirin or another medicine to thin your blood, take it exactly as told by your doctor.  Taking too much of the medicine can cause bleeding.  If you do not take enough, it may not work as well.  Know the side effects of your medicines. If you are taking a blood thinner, make sure you:  Hold pressure over any cuts for longer than usual.  Tell your dentist and other doctors that you take this medicine.  Avoid activities that may cause damage or injury to your body. Eating and drinking   Follow instructions from your doctor about what you cannot eat or drink.  Eat healthy foods.  If you have trouble with swallowing, do these things to avoid choking:  Take small bites when eating.  Eat foods that are soft or pureed. Safety   Follow instructions from your health care team about physical activity.  Use a walker or cane as told by your doctor.  Keep your home safe so you  do not fall. This may include:  Having experts look at your home to make sure it is safe.  Putting grab bars in the bedroom and bathroom.  Using raised toilets.  Putting a seat in the shower. General instructions   Do not use any tobacco products.  Examples of these are cigarettes, chewing tobacco, and e-cigarettes.  If you need help quitting, ask your doctor.  Limit how much alcohol you drink. This means no more than 1 drink a day for nonpregnant women and 2  drinks a day for men. One drink equals 12 oz of beer, 5 oz of wine, or 1 oz of hard liquor.  If you need help to stop using drugs or alcohol, ask your doctor to refer you to a program or specialist.  Stay active. Exercise as told by your doctor.  Keep all follow-up visits as told by your doctor. This is important. Get help right away if:  You suddenly:  Have weakness or loss of feeling in your face, arm, or leg.  Feel confused.  Have trouble talking or understanding what people are saying.  Have trouble seeing.  Have trouble walking.  Have trouble moving your arms or legs.  Feel dizzy.  Lose your balance or coordination.  Have a very bad headache and you do not know why.  You pass out (lose consciousness) or almost pass out.  You have jerky movements that you cannot control (seizure). These symptoms may be an emergency. Do not wait to see if the symptoms will go away. Get medical help right away. Call your local emergency services (911 in the [place] Do not drive yourself to the hospital. This information is not intended to replace advice given to you by your health care provider. Make sure you discuss any questions you have with your health care provider. Document Released: 06/08/2011 Document Revised: 11/30/2015 Document Reviewed: 09/15/2015 Elsevier Interactive Patient Education  2017 [employer] Follow with Donato Schultz, DO in 5-7 days  Please get a complete blood count and chemistry panel checked by your Primary MD at your next visit, and again as instructed by your Primary MD. Please get your medications reviewed and adjusted by your Primary MD.   Aspirin and Your Heart Aspirin is a medicine that affects the way blood clots. Aspirin can be used to help reduce the risk of blood clots, heart attacks, and other heart-related problems. Should I take aspirin? Your health care provider will help you determine whether it is safe and beneficial for you to take  aspirin daily. Taking aspirin daily may be beneficial if you:  Have had a heart attack or chest pain.  Have undergone open heart surgery such as coronary artery bypass surgery (CABG).  Have had coronary angioplasty.  Have experienced a stroke or transient ischemic attack (TIA).  Have peripheral vascular disease (PVD).  Have chronic heart rhythm problems such as atrial fibrillation. Are there any risks of taking aspirin daily? Daily use of aspirin can increase your risk of side effects. Some of these include:  Bleeding. Bleeding problems can be minor or serious. An example of a minor problem is a cut that does not stop bleeding. An example of a more serious problem is stomach bleeding or bleeding into the brain. Your risk of bleeding is increased if you are also taking non-steroidal anti-inflammatory medicine (NSAIDs).  Increased bruising.  Upset stomach.  An allergic reaction. People who have nasal polyps have an increased risk of developing an aspirin allergy. What  are some guidelines I should follow when taking aspirin?  Take aspirin only as directed by your health care provider. Make sure you understand how much you should take and what form you should take. The two forms of aspirin are:  Non-enteric-coated. This type of aspirin does not have a coating and is absorbed quickly. Non-enteric-coated aspirin is usually recommended for people with chest pain. This type of aspirin also comes in a chewable form.  Enteric-coated. This type of aspirin has a special coating that releases the medicine very slowly. Enteric-coated aspirin causes less stomach upset than non-enteric-coated aspirin. This type of aspirin should not be chewed or crushed.  Drink alcohol in moderation. Drinking alcohol increases your risk of bleeding. When should I seek medical care?  You have unusual bleeding or bruising.  You have stomach pain.  You have an allergic reaction. Symptoms of an allergic reaction  include:  Hives.  Itchy skin.  Swelling of the lips, tongue, or face.  You have ringing in your ears. When should I seek immediate medical care?  Your bowel movements are bloody, dark red, or black in color.  You vomit or cough up blood.  You have blood in your urine.  You cough, wheeze, or feel short of breath. If you have any of the following symptoms, this is an emergency. Do not wait to see if the pain will go away. Get medical help at once. Call your local emergency services (911 in the [place] Do not drive yourself to the hospital.  You have severe chest pain, especially if the pain is crushing or pressure-like and spreads to the arms, back, neck, or jaw.  You have stroke-like symptoms, such as:  Loss of vision.  Difficulty talking.  Numbness or weakness on one side of your body.  Numbness or weakness in your arm or leg.  Not thinking clearly or feeling confused. This information is not intended to replace advice given to you by your health care provider. Make sure you discuss any questions you have with your health care provider. Document Released: 06/01/2008 Document Revised: 10/27/2015 Document Reviewed: 09/24/2013 Elsevier Interactive Patient Education  2017 [employer] Inc.  Carotid Artery Disease The carotid arteries are the two main arteries on either side of the neck that supply blood to the brain. Carotid artery disease, also called carotid artery stenosis, is the narrowing or blockage of one or both carotid arteries. Carotid artery disease increases your risk for a stroke or a transient ischemic attack (TIA). A TIA is an episode in which a waxy, fatty substance that accumulates within the artery (plaque) blocks blood flow to the brain. A TIA is considered a "warning stroke." What are the causes?  Buildup of plaque inside the carotid arteries (atherosclerosis) (common).  A weakened outpouching in an artery (aneurysm).  Inflammation of the carotid artery  (arteritis).  A fibrous growth within the carotid artery (fibromuscular dysplasia).  Tissue death within the carotid artery due to radiation treatment (post-radiation necrosis).  Decreased blood flow due to spasms of the carotid artery (vasospasm).  Separation of the walls of the carotid artery (carotid dissection). What increases the risk?  High cholesterol (dyslipidemia).  High blood pressure (hypertension).  Smoking.  Obesity.  Diabetes.  Family history of cardiovascular disease.  Inactivity or lack of regular exercise.  Being male. Men have an increased risk of developing atherosclerosis earlier in life than women. What are the signs or symptoms? Carotid artery disease does not cause symptoms. How is this diagnosed? Diagnosis of  carotid artery disease may include:  A physical exam. Your health care provider may hear an abnormal sound (bruit) when listening to the carotid arteries.  Specific tests that look at the blood flow in the carotid arteries. These tests include:  Carotid artery ultrasonography.  Carotid or cerebral angiography.  Computerized tomographic angiography (CTA).  Magnetic resonance angiography (MRA). How is this treated? Treatment of carotid artery disease can include a combination of treatments. Treatment options include:  Surgery. You may have:  A carotid endarterectomy. This is a surgery to remove the blockages in the carotid arteries.  A carotid angioplasty with stenting. This is a nonsurgical interventional procedure. A wire mesh (stent) is used to widen the blocked carotid arteries.  Medicines to control blood pressure, cholesterol, and reduce blood clotting (antiplatelet therapy).  Adjusting your diet.  Lifestyle changes such as:  Quitting smoking.  Exercising as tolerated or as directed by your health care provider.  Controlling and maintaining a good blood pressure.  Keeping cholesterol levels under control. Follow these  instructions at home:  Take medicines only as directed by your health care provider. Make sure you understand all your medicine instructions. Do not stop your medicines without talking to your health care provider.  Follow your health care provider's diet instructions. It is important to eat a healthy diet that is low in saturated fats and includes plenty of fresh fruits, vegetables, and lean meats. High-fat, high-sodium foods as well as foods that are fried, overly processed, or have poor nutritional value should be avoided.  Maintain a healthy weight.  Stay physically active. It is recommended that you get at least 30 minutes of activity every day.  Do not use any tobacco products including cigarettes, chewing tobacco, or electronic cigarettes. If you need help quitting, ask your health care provider.  Limit alcohol use to:  No more than 2 drinks per day for men.  No more than 1 drink per day for nonpregnant women.  Do not use illegal drugs.  Keep all follow-up visits as directed by your health care provider. Get help right away if: You develop TIA or stroke symptoms. These include:  Sudden weakness or numbness on one side of the body, such as in the face, arm, or leg.  Sudden confusion.  Trouble speaking (aphasia) or understanding.  Sudden trouble seeing out of one or both eyes.  Sudden trouble walking.  Dizziness or feeling like you might faint.  Loss of balance or coordination.  Sudden severe headache with no known cause.  Sudden trouble swallowing (dysphagia). If you have any of these symptoms, call your local emergency services (911 in [place] Do not drive yourself to the clinic or hospital. This is a medical emergency. This information is not intended to replace advice given to you by your health care provider. Make sure you discuss any questions you have with your health care provider. Document Released: 09/11/2011 Document Revised: 11/25/2015 Document Reviewed:  12/18/2012 Elsevier Interactive Patient Education  2017 [employer] Inc.  Please request your Primary MD to go over all Hospital Tests and Procedure/Radiological results at the follow up, please get all Hospital records sent to your Prim MD by signing hospital release before you go home.  If you had Pneumonia of Lung problems at the Hospital: Please get a 2 view Chest X ray done in 6-8 weeks after hospital discharge or sooner if instructed by your Primary MD.  If you have Congestive Heart Failure: Please call your Cardiologist or Primary MD anytime you have  any of the following symptoms:  1) 3 pound weight gain in 24 hours or 5 pounds in 1 week  2) shortness of breath, with or without a dry hacking cough  3) swelling in the hands, feet or stomach  4) if you have to sleep on extra pillows at night in order to breathe  Follow cardiac low salt diet and 1.5 lit/day fluid restriction.  If you have diabetes Accuchecks 4 times/day, Once in AM empty stomach and then before each meal. Log in all results and show them to your primary doctor at your next visit. If any glucose reading is under 80 or above 300 call your primary MD immediately.  If you have Seizure/Convulsions/Epilepsy: Please do not drive, operate heavy machinery, participate in activities at heights or participate in high speed sports until you have seen by Primary MD or a Neurologist and advised to do so again.  If you had Gastrointestinal Bleeding: Please ask your Primary MD to check a complete blood count within one week of discharge or at your next visit. Your endoscopic/colonoscopic biopsies that are pending at the time of discharge, will also need to followed by your Primary MD.  Get Medicines reviewed and adjusted. Please take all your medications with you for your next visit with your Primary MD  Please request your Primary MD to go over all hospital tests and procedure/radiological results at the follow up, please ask your  Primary MD to get all Hospital records sent to his/her office.  If you experience worsening of your admission symptoms, develop shortness of breath, life threatening emergency, suicidal or homicidal thoughts you must seek medical attention immediately by calling 911 or calling your MD immediately  if symptoms less severe.  You must read complete instructions/literature along with all the possible adverse reactions/side effects for all the Medicines you take and that have been prescribed to you. Take any new Medicines after you have completely understood and accpet all the possible adverse reactions/side effects.   Do not drive or operate heavy machinery when taking Pain medications.   Do not take more than prescribed Pain, Sleep and Anxiety Medications  Special Instructions: If you have smoked or chewed Tobacco  in the last 2 yrs please stop smoking, stop any regular Alcohol  and or any Recreational drug use.  Wear Seat belts while driving.  Please note You were cared for by a hospitalist during your hospital stay. If you have any questions about your discharge medications or the care you received while you were in the hospital after you are discharged, you can call the unit and asked to speak with the hospitalist on call if the hospitalist that took care of you is not available. Once you are discharged, your primary care physician will handle any further medical issues. Please note that NO REFILLS for any discharge medications will be authorized once you are discharged, as it is imperative that you return to your primary care physician (or establish a relationship with a primary care physician if you do not have one) for your aftercare needs so that they can reassess your need for medications and monitor your lab values.  You can reach the hospitalist office at phone [phone] or fax [phone]   If you do not have a primary care physician, you can call [phone] for a physician  referral.  Activity: As tolerated with Full fall precautions use walker/cane & assistance as needed  Diet: regular  Disposition Home

## 2016-10-18 NOTE — Progress Notes (Signed)
Patient received to room 5M11.  Patient introduced to staff and to unit routine.  Bed in low position, locked, and call light placed close to patient.

## 2016-10-18 NOTE — ED Notes (Signed)
Pt to MRI.  MRI will call when pt needs to be transported.

## 2016-10-18 NOTE — ED Notes (Signed)
Admitting MD at bedside.

## 2016-10-18 NOTE — ED Notes (Signed)
Attempted report 

## 2016-10-18 NOTE — H&P (Signed)
History and Physical    [name]:[id number] DOB: 05/07/1966 DOA: 10/18/2016  PCP: Donato Schultz, DO   Patient coming from: Home  Chief Complaint: Left hand numbness and weakness  HPI: Dale Murillo is a 51 y.o. male with no significant past medical history, now presenting to the emergency department with transient numbness and weakness in the left hand. Patient reports that he woke overnight with numbness involving the left hand and noted the hand to be weak as well. As he was attempting to brush his teeth, he had difficulty squeezing the toothpaste out of the tube due to perceived weakness in the left hand. Hand also felt numb. He denied any change in vision or hearing, headache, loss of coordination, or any other focal numbness or weakness. He had never experienced similar symptoms previously. There's been no recent trauma or head injury. He denies any use of alcohol or illicit substances. Patient describes a strong family history of CVA, including in his mother, father, and brother. Patient does not take any daily medications. He reports that these symptoms began to improve, but given the family history he was understandably concerned for possible stroke and has come into the ED for evaluation.   ED Course: Upon arrival to the ED, patient is found to be afebrile, saturating well on room air, intermittently hypertensive, and with vitals otherwise stable. EKG features a sinus rhythm with T-wave inversion in leads 3 and aVF and flattening of the T waves laterally. Noncontrast head CT is negative for acute intracranial abnormality. Chemistry panels notable for a serum creatinine 1.41 which appears consistent with priors. CBC is unremarkable, INR is within the normal limits, troponin is undetectable, and ethanol is undetectable. Urinalysis and UDS have been ordered and remained pending. Neurology was consulted by the ED physician and has evaluated the patient in the emergency  department. A medical admission for further evaluation of possible TIA/CVA is advised. The patient has remained hemodynamically stable and in no apparent respiratory distress and will be observed on the telemetry unit for ongoing evaluation and management of transient left hand numbness and weakness concerning for possible TIA or stroke.  Review of Systems:  All other systems reviewed and apart from HPI, are negative.  History reviewed. No pertinent past medical history.  History reviewed. No pertinent surgical history.   reports that he has never smoked. He has never used smokeless tobacco. He reports that he does not drink alcohol or use drugs.  No Known Allergies  Family History  Problem Relation Age of Onset  . Stroke Mother   . Stroke Father      Prior to Admission medications   Not on File    Physical Exam: Vitals:   10/18/16 0345 10/18/16 0430 10/18/16 0530 10/18/16 0600  BP: (!) 149/92 (!) 143/91 (!) 141/92 (!) 147/95  Pulse: (!) 59 (!) 55 62 75  Resp: Temp:      TempSrc:      SpO2: 96% 96% 96% 97%  Weight:      Height:          Constitutional: NAD, calm, comfortable Eyes: PERTLA, lids and conjunctivae normal ENMT: Mucous membranes are moist. Posterior pharynx clear of any exudate or lesions.   Neck: normal, supple, no masses, no thyromegaly Respiratory: clear to auscultation bilaterally, no wheezing, no crackles. Normal respiratory effort.   Cardiovascular: S1 & S2 heard, regular rate and rhythm, no significant murmur. No extremity edema. No carotid bruits.  No significant JVD. Abdomen: No distension, no tenderness, no masses palpated. Bowel sounds normal.  Musculoskeletal: no clubbing / cyanosis. No joint deformity upper and lower extremities. Normal muscle tone.  Skin: no significant rashes, lesions, ulcers. Warm, dry, well-perfused. Neurologic: CN 2-12 grossly intact. Sensation intact, DTR normal. Strength 5/5 in all 4 limbs.  Psychiatric:  Alert and oriented x 3. Normal mood and affect.     Labs on Admission: I have personally reviewed following labs and imaging studies  CBC:  Recent Labs Lab 10/18/16 0145 10/18/16 0447 10/18/16 0504  WBC 7.0  --   --   NEUTROABS 1.7 2.4  --   HGB 14.5  --  17.0  HCT 44.9  --  50.0  MCV 81.3  --   --   PLT 210  --   --    Basic Metabolic Panel:  Recent Labs Lab 10/18/16 0145 10/18/16 0504  NA 138 140  K 3.8 3.8  CL 103 104  CO2 26  --   GLUCOSE 104* 105*  BUN 13 14  CREATININE 1.41* 1.30*  CALCIUM 9.2  --    GFR: Estimated Creatinine Clearance: 87 mL/min (A) (by C-G formula based on SCr of 1.3 mg/dL (H)). Liver Function Tests:  Recent Labs Lab 10/18/16 0145  AST 40  ALT 27  ALKPHOS 72  BILITOT 0.7  PROT 7.2  ALBUMIN 4.1   No results for input(s): LIPASE, AMYLASE in the last 168 hours. No results for input(s): AMMONIA in the last 168 hours. Coagulation Profile:  Recent Labs Lab 10/18/16 0447  INR 1.04   Cardiac Enzymes: No results for input(s): CKTOTAL, CKMB, CKMBINDEX, TROPONINI in the last 168 hours. BNP (last 3 results) No results for input(s): PROBNP in the last 8760 hours. HbA1C: No results for input(s): HGBA1C in the last 72 hours. CBG: No results for input(s): GLUCAP in the last 168 hours. Lipid Profile: No results for input(s): CHOL, HDL, LDLCALC, TRIG, CHOLHDL, LDLDIRECT in the last 72 hours. Thyroid Function Tests: No results for input(s): TSH, T4TOTAL, FREET4, T3FREE, THYROIDAB in the last 72 hours. Anemia Panel: No results for input(s): VITAMINB12, FOLATE, FERRITIN, TIBC, IRON, RETICCTPCT in the last 72 hours. Urine analysis:    Component Value Date/Time   COLORURINE YELLOW 01/30/2007 2224   APPEARANCEUR CLEAR 01/30/2007 2224   LABSPEC 1.004 (L) 01/30/2007 2224   PHURINE 6.5 01/30/2007 2224   GLUCOSEU NEG mg/dL 11/91/4782 9562   HGBUR negative 01/03/2007 1430   BILIRUBINUR NEG 01/30/2007 2224   KETONESUR NEG mg/dL 13/01/6577  4696   PROTEINUR NEG mg/dL 29/52/8413 2440   UROBILINOGEN 0.2 01/30/2007 2224   NITRITE NEG 01/30/2007 2224   LEUKOCYTESUR NEG 01/30/2007 2224   Sepsis Labs: (procalcitonin:4,lacticidven:4) )No results found for this or any previous visit (from the past 240 hour(s)).   Radiological Exams on Admission: Ct Head Wo Contrast  Result Date: 10/18/2016 CLINICAL DATA:  Left hand weakness, now resolved. Family history of stroke. EXAM: CT HEAD WITHOUT CONTRAST TECHNIQUE: Contiguous axial images were obtained from the base of the skull through the vertex without intravenous contrast. COMPARISON:  11/03/2003 FINDINGS: Brain: No evidence of acute infarction, hemorrhage, hydrocephalus, extra-axial collection or mass lesion/mass effect. Vascular: No hyperdense vessel or unexpected calcification. Skull: Normal. Negative for fracture or focal lesion. Sinuses/Orbits: Mucosal thickening in the paranasal sinuses. No acute air-fluid levels. Mastoid air cells are not opacified. Other: None. IMPRESSION: No acute intracranial abnormalities. Electronically Signed   By: Burman Nieves M.D.   On: 10/18/2016 05:26  EKG: Independently reviewed. Sinus rhythm, T-wave inversions in III and aVF with flattening in lateral leads, no prior available  Assessment/Plan  1. Transient left hand numbness and weakness  - Pt presents with numbness and weakness in left hand, resolved by time of admission - He reports a strong FHx of CVA  - Neurology is consulting and much appreciated - Head CT is negative for acute intracranial abnormality  - Monitor on telemetry with frequent neuro checks - Obtain MRI brain, MRA head, carotid dopplers, echo, fasting lipids, and A1c  - Start prophylactic aspirin  - PT, OT evals requested    DVT prophylaxis: sq Lovenox  Code Status: Full  Family Communication: Discussed with patient Disposition Plan: Observe on telemetry Consults called: Neurology Admission status: Observation      Briscoe Deutscher, MD [facility] Pager [phone]  If 7PM-7AM, please contact night-coverage [email or web address] Password [facility]  10/18/2016, 6:28 AM

## 2016-10-18 NOTE — Progress Notes (Signed)
STROKE TEAM PROGRESS NOTE   SUBJECTIVE (INTERVAL HISTORY) No family is at the bedside.  He recounted HPI with DR. Sethi.    OBJECTIVE Temp:  [98.6 F (37 C)] 98.6 F (37 C) (04/18 0134) Pulse Rate:  [55-79] 79 (04/18 0845) Resp:  [13-18] 18 (04/18 0845) BP: (122-176)/(86-107) 122/107 (04/18 0845) SpO2:  [96 %-98 %] 98 % (04/18 0845) Weight:  [103 kg (227 lb)] 103 kg (227 lb) (04/18 0134)  CBC:  Recent Labs Lab 10/18/16 0145 10/18/16 0447 10/18/16 0504  WBC 7.0  --   --   NEUTROABS 1.7 2.4  --   HGB 14.5  --  17.0  HCT 44.9  --  50.0  MCV 81.3  --   --   PLT 210  --   --     Basic Metabolic Panel:  Recent Labs Lab 10/18/16 0145 10/18/16 0504  NA 138 140  K 3.8 3.8  CL 103 104  CO2 26  --   GLUCOSE 104* 105*  BUN 13 14  CREATININE 1.41* 1.30*  CALCIUM 9.2  --     Lipid Panel: No results found for: CHOL, TRIG, HDL, CHOLHDL, VLDL, LDLCALC HgbA1c: No results found for: HGBA1C Urine Drug Screen:    Component Value Date/Time   LABOPIA NONE DETECTED 10/18/2016 0655   COCAINSCRNUR NONE DETECTED 10/18/2016 0655   LABBENZ NONE DETECTED 10/18/2016 0655   AMPHETMU NONE DETECTED 10/18/2016 0655   THCU NONE DETECTED 10/18/2016 0655   LABBARB NONE DETECTED 10/18/2016 0655    Alcohol Level     Component Value Date/Time   ETH <5 10/18/2016 0447    PHYSICAL EXAM Pleasant healthy looking middle aged african [place] male not in distress. . Afebrile. Head is nontraumatic. Neck is supple without bruit.    Cardiac exam no murmur or gallop. Lungs are clear to auscultation. Distal pulses are well felt. Neurological Exam ;  Awake  Alert oriented x 3. Normal speech and language.eye movements full without nystagmus.fundi were not visualized. Vision acuity and fields appear normal. Hearing is normal. Palatal movements are normal. Face symmetric. Tongue midline. Normal strength, tone, reflexes and coordination. Normal sensation. Gait deferred.  ASSESSMENT/PLAN [name] is a 51 y.o. male with history of HTN and family hx stroke presenting with transient weakness of L hand. He did not receive IV t-PA due to deficits resolved.   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 Followed at [facility] PCP on 68)  Hypertension  Stable BP goal normotensive  Other Stroke Risk Factors  UDS / ETOH screens negative this admission  Family hx stroke (mother, father at 51)  Hospital day # 0  Rhoderick Moody [facility] See Amion for Pager information 10/18/2016 10:54 AM  I have personally examined this patient, reviewed notes, independently viewed imaging studies, participated in medical decision making and plan of care.ROS completed by me personally and pertinent positives fully documented  I have made any additions or  clarifications directly to the above note. Agree with note above. He presented with transient left hand weakness and numbness involving mainly the thumb but upon awakening from sleep. Clinical presentation suggests pseudoradicular pattern of weakness/numbness which may rarely be seen with a stroke but more likely from peripheral radial nerve compression in sleep. He has strong family history of strokes hence recommend aspirin 81 mg daily as well as continue ongoing stroke evaluation and aggressive risk factor modification. Greater than 50% time during this 35 minute visit was spent on counseling and coordination of care about stroke risk, radial nerve palsy  and answering questions  Delia Heady, MD Medical Director [facility] Pager: [phone] 10/18/2016 3:25 PM  To contact Stroke Continuity provider, please refer to [email or web address]. After hours, contact General Neurology

## 2016-10-18 NOTE — ED Provider Notes (Signed)
[facility]-EMERGENCY DEPT Provider Note   CSN: [id number] Arrival date & time: 10/18/16  0128     History   Chief Complaint Chief Complaint  Patient presents with  . Hand Tingling    HPI Dale Murillo is a 51 y.o. male with no significant past medical history presenting today with left hand numbness. Patient states he woke up with his hand being numb and having weakness in his hand. He states he could not flex and make a fist. He also cannot squeeze toothpaste out of a tube. He is concerned because both of his parents and his brother have had a stroke in the past. He denies any weakness or numbness elsewhere. He had no facial concerns. He denies having a stroke personally in the past. There are no further complaints.     10 Systems reviewed and are negative for acute change except as noted in the HPI.   HPI  History reviewed. No pertinent past medical history.  Patient Active Problem List   Diagnosis Date Noted  . ABNORMAL RESULT, FUNCTION STUDY, KIDNEY 01/03/2007    History reviewed. No pertinent surgical history.     Home Medications    Prior to Admission medications   Not on File    Family History Family History  Problem Relation Age of Onset  . Stroke Mother   . Stroke Father     Social History Social History  Substance Use Topics  . Smoking status: Never Smoker  . Smokeless tobacco: Never Used  . Alcohol use No     Allergies   Patient has no known allergies.   Review of Systems Review of Systems   Physical Exam Updated Vital Signs BP (!) 143/91   Pulse (!) 55   Temp 98.6 F (37 C) (Oral)   Resp 13   Ht  (1.88 m)   Wt 227 lb (103 kg)   SpO2 96%   BMI 29.15 kg/m   Physical Exam  Constitutional: He is oriented to person, place, and time. Vital signs are normal. He appears well-developed and well-nourished.  Non-toxic appearance. He does not appear ill. No distress.  HENT:  Head: Normocephalic and atraumatic.  Nose: Nose normal.    Mouth/Throat: Oropharynx is clear and moist. No oropharyngeal exudate.  Eyes: Conjunctivae and EOM are normal. Pupils are equal, round, and reactive to light. No scleral icterus.  Neck: Normal range of motion. Neck supple. No tracheal deviation, no edema, no erythema and normal range of motion present. No thyroid mass and no thyromegaly present.  Cardiovascular: Normal rate, regular rhythm, S1 normal, S2 normal, normal heart sounds, intact distal pulses and normal pulses.  Exam reveals no gallop and no friction rub.   No murmur heard. Pulmonary/Chest: Effort normal and breath sounds normal. No respiratory distress. He has no wheezes. He has no rhonchi. He has no rales.  Abdominal: Soft. Normal appearance and bowel sounds are normal. He exhibits no distension, no ascites and no mass. There is no hepatosplenomegaly. There is no tenderness. There is no rebound, no guarding and no CVA tenderness.  Musculoskeletal: Normal range of motion. He exhibits no edema or tenderness.  Lymphadenopathy:    He has no cervical adenopathy.  Neurological: He is alert and oriented to person, place, and time. He has normal strength. No cranial nerve deficit or sensory deficit.  Normal strength and sensation in all extremities. Normal cerebellar testing.  Skin: Skin is warm, dry and intact. No petechiae and no rash noted. He is not  diaphoretic. No erythema. No pallor.  Nursing note and vitals reviewed.    ED Treatments / Results  Labs (all labs ordered are listed, but only abnormal results are displayed) Labs Reviewed  CBC WITH DIFFERENTIAL/PLATELET - Abnormal; Notable for the following:       Result Value   RDW 16.0 (*)    Lymphs Abs 4.5 (*)    All other components within normal limits  COMPREHENSIVE METABOLIC PANEL - Abnormal; Notable for the following:    Glucose, Bld 104 (*)    Creatinine, Ser 1.41 (*)    GFR calc non Af Amer 57 (*)    All other components within normal limits  I-STAT CHEM 8, ED -  Abnormal; Notable for the following:    Creatinine, Ser 1.30 (*)    Glucose, Bld 105 (*)    Calcium, Ion 1.06 (*)    All other components within normal limits  PROTIME-INR  APTT  DIFFERENTIAL  ETHANOL  RAPID URINE DRUG SCREEN, HOSP PERFORMED  URINALYSIS, ROUTINE W REFLEX MICROSCOPIC  I-STAT TROPOININ, ED    EKG  EKG Interpretation  Date/Time:  Wednesday October 18 2016 01:37:39 EDT Ventricular Rate:  72 PR Interval:  154 QRS Duration: 70 QT Interval:  384 QTC Calculation: 420 R Axis:   78 Text Interpretation:  Normal sinus rhythm T wave abnormality, consider inferior ischemia Abnormal ECG No old tracing to compare Confirmed by Erroll Luna [phone]) on 10/18/2016 4:23:48 AM       Radiology Ct Head Wo Contrast  Result Date: 10/18/2016 CLINICAL DATA:  Left hand weakness, now resolved. Family history of stroke. EXAM: CT HEAD WITHOUT CONTRAST TECHNIQUE: Contiguous axial images were obtained from the base of the skull through the vertex without intravenous contrast. COMPARISON:  11/03/2003 FINDINGS: Brain: No evidence of acute infarction, hemorrhage, hydrocephalus, extra-axial collection or mass lesion/mass effect. Vascular: No hyperdense vessel or unexpected calcification. Skull: Normal. Negative for fracture or focal lesion. Sinuses/Orbits: Mucosal thickening in the paranasal sinuses. No acute air-fluid levels. Mastoid air cells are not opacified. Other: None. IMPRESSION: No acute intracranial abnormalities. Electronically Signed   By: Burman Nieves M.D.   On: 10/18/2016 05:26    Procedures Procedures (including critical care time)  Medications Ordered in ED Medications - No data to display   Initial Impression / Assessment and Plan / ED Course  I have reviewed the triage vital signs and the nursing notes.  Pertinent labs & imaging results that were available during my care of the patient were reviewed by me and considered in my medical decision making (see chart for  details).     Patient presents to emergency department for left hand weakness. History is concerning for TIA, discussed the case with Dr. Roseanne Reno who agrees with stroke workup and admission for further evaluation. Labs are unremarkable, CT scan of the head is negative. Currently his symptoms have resolved. Will page hospitalist for further care.  Final Clinical Impressions(s) / ED Diagnoses   Final diagnoses:  None    New Prescriptions New Prescriptions   No medications on file     Tomasita Crumble, MD 10/18/16 [phone]

## 2016-10-19 ENCOUNTER — Telehealth: Payer: Self-pay | Admitting: Behavioral Health

## 2016-10-19 LAB — VAS US CAROTID
LEFT ECA DIAS: -17 cm/s
LEFT VERTEBRAL DIAS: -11 cm/s
LICAPDIAS: -24 cm/s
LICAPSYS: -70 cm/s
Left CCA dist dias: -29 cm/s
Left CCA dist sys: -128 cm/s
Left CCA prox dias: 26 cm/s
Left CCA prox sys: 144 cm/s
Left ICA dist dias: -39 cm/s
Left ICA dist sys: -104 cm/s
RCCAPDIAS: 26 cm/s
RCCAPSYS: 178 cm/s
RIGHT ECA DIAS: -17 cm/s
RIGHT VERTEBRAL DIAS: -23 cm/s
Right cca dist sys: -97 cm/s

## 2016-10-19 NOTE — Discharge Summary (Signed)
Physician Discharge Summary  Dale Murillo ZOX:[id number] DOB: 02-Mar-1966 DOA: 10/18/2016  PCP: Donato Schultz, DO  Admit date: 10/18/2016 Discharge date: 10/19/2016  Admitted From: home Disposition:  home  Recommendations for Outpatient Follow-up:  1. Follow up with PCP in 1-2 weeks 2. Patient started on aspirin on discharge  Home Health: None Equipment/Devices: None  Discharge Condition: Stable CODE STATUS: Full code Diet recommendation: Heart healthy  HPI: Per Dr. Antionette Char, Dale Murillo is a 51 y.o. male with no significant past medical history, now presenting to the emergency department with transient numbness and weakness in the left hand. Patient reports that he woke overnight with numbness involving the left hand and noted the hand to be weak as well. As he was attempting to brush his teeth, he had difficulty squeezing the toothpaste out of the tube due to perceived weakness in the left hand. Hand also felt numb. He denied any change in vision or hearing, headache, loss of coordination, or any other focal numbness or weakness. He had never experienced similar symptoms previously. There's been no recent trauma or head injury. He denies any use of alcohol or illicit substances. Patient describes a strong family history of CVA, including in his mother, father, and brother. Patient does not take any daily medications. He reports that these symptoms began to improve, but given the family history he was understandably concerned for possible stroke and has come into the ED for evaluation.   Hospital Course: Discharge Diagnoses:  Principal Problem:   TIA (transient ischemic attack) Active Problems:   Numbness of left hand   Left hand weakness  Transient left hand numbness/weakness -patient without many medical problems, other at the hospital with transient left hand numbness.  This is resolved.  Neurology was consulted and have evaluated patient while hospitalized.  Discussed  with Dr. Pearlean Brownie, there is a possibility that he had peripheral radial nerve compression in his sleep rather than TIA.  He underwent workup regardless, an MRI was negative for CVA.  2D echo was done and showed normal ejection fraction and no diastolic dysfunction.  He underwent a carotid duplex which showed no evidence of extracranial ICA stenosis.  Given strong family history for CVA, patient was placed on aspirin 81 mg daily per neurology recommendations.  He was discharged home in stable condition, back to baseline, and was advised to follow-up as an outpatient with his primary care doctor.   Discharge Instructions   Allergies as of 10/18/2016   No Known Allergies     Medication List    TAKE these medications   aspirin EC 81 MG tablet Take 1 tablet (81 mg total) by mouth daily.      Follow-up Information    Lelon Perla Chase, DO. Schedule an appointment as soon as possible for a visit in 2 week(s).   Specialty:  Family Medicine Contact information: [address] [place] [address] [phone]          No Known Allergies  Consultations:  Neurology  Procedures/Studies:  2D echo Study Conclusions - Left ventricle: The cavity size was normal. Systolic function was vigorous. The estimated ejection fraction was in the range of 65% to 70%. Wall motion was normal; there were no regional wall motion abnormalities. Left ventricular diastolic function parameters were normal. - Mitral valve: Calcified annulus. Mildly thickened leaflets . - Atrial septum: A patent foramen ovale cannot be excluded. - Pulmonary arteries: Systolic pressure could not be accurately estimated.  Ct Head Wo Contrast  Result Date: 10/18/2016 CLINICAL DATA:  Left hand weakness, now resolved. Family history of stroke. EXAM: CT HEAD WITHOUT CONTRAST TECHNIQUE: Contiguous axial images were obtained from the base of the skull through the vertex without intravenous contrast. COMPARISON:   11/03/2003 FINDINGS: Brain: No evidence of acute infarction, hemorrhage, hydrocephalus, extra-axial collection or mass lesion/mass effect. Vascular: No hyperdense vessel or unexpected calcification. Skull: Normal. Negative for fracture or focal lesion. Sinuses/Orbits: Mucosal thickening in the paranasal sinuses. No acute air-fluid levels. Mastoid air cells are not opacified. Other: None. IMPRESSION: No acute intracranial abnormalities. Electronically Signed   By: Burman Nieves M.D.   On: 10/18/2016 05:26   Mr Brain Wo Contrast  Result Date: 10/18/2016 CLINICAL DATA:  Left hand numbness and weakness EXAM: MRI HEAD WITHOUT CONTRAST MRA HEAD WITHOUT CONTRAST TECHNIQUE: Multiplanar, multiecho pulse sequences of the brain and surrounding structures were obtained without intravenous contrast. Angiographic images of the head were obtained using MRA technique without contrast. COMPARISON:  Head CT from earlier today FINDINGS: MRI HEAD FINDINGS Brain: No acute or remote infarction, hemorrhage, hydrocephalus, extra-axial collection or mass lesion. Normal brain volume. No white matter disease. Vascular: Arterial findings below. Normal dural venous sinus flow voids. Skull and upper cervical spine: No marrow lesion noted. Sinuses/Orbits: Negative orbits. Mild generalized mucosal thickening in the paranasal sinuses. MRA HEAD FINDINGS Symmetric carotid and vertebral arteries. No unusual branching. A small outpouching from the right supraclinoid ICA on reformats has a small emanating vessel on source images and is attributed to infundibulum. No major branch occlusion or flow limiting stenosis. No beading. IMPRESSION: Negative brain MRI and MRA.  No explanation for symptoms. Electronically Signed   By: Marnee Spring M.D.   On: 10/18/2016 08:37   Mr Maxine Glenn Head/brain WU Cm  Result Date: 10/18/2016 CLINICAL DATA:  Left hand numbness and weakness EXAM: MRI HEAD WITHOUT CONTRAST MRA HEAD WITHOUT CONTRAST TECHNIQUE:  Multiplanar, multiecho pulse sequences of the brain and surrounding structures were obtained without intravenous contrast. Angiographic images of the head were obtained using MRA technique without contrast. COMPARISON:  Head CT from earlier today FINDINGS: MRI HEAD FINDINGS Brain: No acute or remote infarction, hemorrhage, hydrocephalus, extra-axial collection or mass lesion. Normal brain volume. No white matter disease. Vascular: Arterial findings below. Normal dural venous sinus flow voids. Skull and upper cervical spine: No marrow lesion noted. Sinuses/Orbits: Negative orbits. Mild generalized mucosal thickening in the paranasal sinuses. MRA HEAD FINDINGS Symmetric carotid and vertebral arteries. No unusual branching. A small outpouching from the right supraclinoid ICA on reformats has a small emanating vessel on source images and is attributed to infundibulum. No major branch occlusion or flow limiting stenosis. No beading. IMPRESSION: Negative brain MRI and MRA.  No explanation for symptoms. Electronically Signed   By: Marnee Spring M.D.   On: 10/18/2016 08:37     Subjective: - no chest pain, shortness of breath, no abdominal pain, nausea or vomiting.   Discharge Exam: Vitals:   10/18/16 0845 10/18/16 1423  BP: (!) 122/107 139/76  Pulse: 79 75  Resp: 18 18  Temp: 98.7 F (37.1 C) 98.6 F (37 C)   Vitals:   10/18/16 0600 10/18/16 0645 10/18/16 0845 10/18/16 1423  BP: (!) 147/95 (!) 150/86 (!) 122/107 139/76  Pulse: 75 77 79 75  Resp: Temp:   98.7 F (37.1 C) 98.6 F (37 C)  TempSrc:   Oral Oral  SpO2: 97% 97% 98% 99%  Weight:  Height:        General: Pt is alert, awake, not in acute distress Cardiovascular: RRR, S1/S2 +, no rubs, no gallops Respiratory: CTA bilaterally, no wheezing, no rhonchi Abdominal: Soft, NT, ND, bowel sounds + Extremities: no edema, no cyanosis    The results of significant diagnostics from this hospitalization (including imaging,  microbiology, ancillary and laboratory) are listed below for reference.     Microbiology: No results found for this or any previous visit (from the past 240 hour(s)).   Labs: BNP (last 3 results) No results for input(s): BNP in the last 8760 hours. Basic Metabolic Panel:  Recent Labs Lab 10/18/16 0145 10/18/16 0504  NA 138 140  K 3.8 3.8  CL 103 104  CO2 26  --   GLUCOSE 104* 105*  BUN 13 14  CREATININE 1.41* 1.30*  CALCIUM 9.2  --    Liver Function Tests:  Recent Labs Lab 10/18/16 0145  AST 40  ALT 27  ALKPHOS 72  BILITOT 0.7  PROT 7.2  ALBUMIN 4.1   No results for input(s): LIPASE, AMYLASE in the last 168 hours. No results for input(s): AMMONIA in the last 168 hours. CBC:  Recent Labs Lab 10/18/16 0145 10/18/16 0447 10/18/16 0504  WBC 7.0  --   --   NEUTROABS 1.7 2.4  --   HGB 14.5  --  17.0  HCT 44.9  --  50.0  MCV 81.3  --   --   PLT 210  --   --    Cardiac Enzymes: No results for input(s): CKTOTAL, CKMB, CKMBINDEX, TROPONINI in the last 168 hours. BNP: Invalid input(s): POCBNP CBG: No results for input(s): GLUCAP in the last 168 hours. D-Dimer No results for input(s): DDIMER in the last 72 hours. Hgb A1c No results for input(s): HGBA1C in the last 72 hours. Lipid Profile No results for input(s): CHOL, HDL, LDLCALC, TRIG, CHOLHDL, LDLDIRECT in the last 72 hours. Thyroid function studies No results for input(s): TSH, T4TOTAL, T3FREE, THYROIDAB in the last 72 hours.  Invalid input(s): FREET3 Anemia work up No results for input(s): VITAMINB12, FOLATE, FERRITIN, TIBC, IRON, RETICCTPCT in the last 72 hours. Urinalysis    Component Value Date/Time   COLORURINE YELLOW 10/18/2016 0655   APPEARANCEUR CLEAR 10/18/2016 0655   LABSPEC 1.014 10/18/2016 0655   PHURINE 6.0 10/18/2016 0655   GLUCOSEU NEGATIVE 10/18/2016 0655   GLUCOSEU NEG mg/dL 16/04/9603 5409   HGBUR NEGATIVE 10/18/2016 0655   HGBUR negative 01/03/2007 1430   BILIRUBINUR NEGATIVE  10/18/2016 0655   KETONESUR NEGATIVE 10/18/2016 0655   PROTEINUR NEGATIVE 10/18/2016 0655   UROBILINOGEN 0.2 01/30/2007 2224   NITRITE NEGATIVE 10/18/2016 0655   LEUKOCYTESUR NEGATIVE 10/18/2016 0655   Sepsis Labs Invalid input(s): PROCALCITONIN,  WBC,  LACTICIDVEN Microbiology No results found for this or any previous visit (from the past 240 hour(s)).   Time coordinating discharge: 35 minutes  SIGNED:  Pamella Pert, MD  [facility] 10/19/2016, 11:00 AM Pager [phone]  If 7PM-7AM, please contact night-coverage [email or web address] Password [email or web address]

## 2016-10-19 NOTE — Telephone Encounter (Signed)
Hospital follow-up scheduled for 10/24/16 at 5:00 PM with PCP.

## 2016-10-24 ENCOUNTER — Inpatient Hospital Stay: Payer: BLUE CROSS/BLUE SHIELD | Admitting: Family Medicine

## 2016-10-26 NOTE — Telephone Encounter (Signed)
Appointment has been rescheduled for 10/31/16 at 9:15 AM w/ Esperanza Richters, PA-C.

## 2016-10-31 ENCOUNTER — Telehealth: Payer: Self-pay | Admitting: Family Medicine

## 2016-10-31 ENCOUNTER — Inpatient Hospital Stay: Payer: BLUE CROSS/BLUE SHIELD | Admitting: Medical

## 2016-10-31 NOTE — Telephone Encounter (Signed)
No charge but notify them of our policy for future.

## 2016-10-31 NOTE — Telephone Encounter (Signed)
Pt's spouse called in to cancel appt for spouse 8:55. She says that pt has to work and is unable to make appt.    Should pt be charged?

## 2018-01-28 DIAGNOSIS — H01009 Unspecified blepharitis unspecified eye, unspecified eyelid: Secondary | ICD-10-CM | POA: Diagnosis not present

## 2018-02-11 DIAGNOSIS — J209 Acute bronchitis, unspecified: Secondary | ICD-10-CM | POA: Diagnosis not present

## 2018-02-27 DIAGNOSIS — R252 Cramp and spasm: Secondary | ICD-10-CM | POA: Diagnosis not present

## 2018-02-27 DIAGNOSIS — E860 Dehydration: Secondary | ICD-10-CM | POA: Diagnosis not present

## 2018-07-31 DIAGNOSIS — Z0000 Encounter for general adult medical examination without abnormal findings: Secondary | ICD-10-CM | POA: Diagnosis not present

## 2018-08-28 DIAGNOSIS — Z1211 Encounter for screening for malignant neoplasm of colon: Secondary | ICD-10-CM | POA: Diagnosis not present

## 2018-09-05 DIAGNOSIS — M545 Low back pain: Secondary | ICD-10-CM | POA: Diagnosis not present

## 2018-11-08 DIAGNOSIS — Z1211 Encounter for screening for malignant neoplasm of colon: Secondary | ICD-10-CM | POA: Diagnosis not present

## 2018-11-08 DIAGNOSIS — Z01818 Encounter for other preprocedural examination: Secondary | ICD-10-CM | POA: Diagnosis not present

## 2019-06-19 DIAGNOSIS — K7689 Other specified diseases of liver: Secondary | ICD-10-CM | POA: Diagnosis not present

## 2019-06-19 DIAGNOSIS — K862 Cyst of pancreas: Secondary | ICD-10-CM | POA: Diagnosis not present

## 2019-06-19 DIAGNOSIS — Z9889 Other specified postprocedural states: Secondary | ICD-10-CM | POA: Diagnosis not present

## 2019-10-28 DIAGNOSIS — Z20828 Contact with and (suspected) exposure to other viral communicable diseases: Secondary | ICD-10-CM | POA: Diagnosis not present

## 2019-11-10 ENCOUNTER — Telehealth (INDEPENDENT_AMBULATORY_CARE_PROVIDER_SITE_OTHER): Payer: BC Managed Care – PPO | Admitting: Family Medicine

## 2019-11-10 ENCOUNTER — Encounter: Payer: Self-pay | Admitting: Family Medicine

## 2019-11-10 ENCOUNTER — Other Ambulatory Visit: Payer: Self-pay

## 2019-11-10 DIAGNOSIS — U071 COVID-19: Secondary | ICD-10-CM

## 2019-11-10 MED ORDER — ALBUTEROL SULFATE HFA 108 (90 BASE) MCG/ACT IN AERS: 2.0000 | INHALATION_SPRAY | Freq: Four times a day (QID) | RESPIRATORY_TRACT | 1 refills | Status: DC | PRN

## 2019-11-10 MED ORDER — BENZONATATE 100 MG PO CAPS: 100.0000 mg | ORAL_CAPSULE | Freq: Three times a day (TID) | ORAL | 0 refills | Status: DC | PRN

## 2019-11-10 MED ORDER — PROMETHAZINE-DM 6.25-15 MG/5ML PO SYRP: 5.0000 mL | ORAL_SOLUTION | Freq: Four times a day (QID) | ORAL | 0 refills | Status: DC | PRN

## 2019-11-10 NOTE — Progress Notes (Signed)
Chief Complaint  Patient presents with  . New Patient (Initial Visit)    tested positive for Covid 10/20/19  . Cough    Needs cough medication       New Patient Visit SUBJECTIVE: HPI: Dale Murillo is an 54 y.o.male who is being seen for establishing care. Due to COVID-19 pandemic, we are interacting via telephone (attempted several times to est video link but his phone did not work with audio/video). I verified patient's ID using 2 identifiers. Patient agreed to proceed with visit via this method. Patient is at home, I am at office. Patient and I are present for visit.   10/30/19 dx'd w covid, first started having s/s's on 10/26/19. Has been using ibuprofen, pushing fluids. He is still dealing with a cough and fatigue. His 10 d of s/s's ended on 11/05/19. Someone at his church was sick and did not tell anyone. Denies fevers, N/V/D, myalgias, wheezing, ST, ear pain/drainage, runny/stuffy nose, itchy/watery eyes, sinus pain.   History reviewed. No pertinent past medical history. Past Surgical History:  Procedure Laterality Date  . NO PAST SURGERIES     Family History  Problem Relation Age of Onset  . Stroke Mother   . Stroke Father    No Known Allergies  Current Outpatient Medications:  .  ibuprofen (ADVIL) 800 MG tablet, Take 800 mg by mouth daily., Disp: , Rfl:  .  albuterol (VENTOLIN HFA) 108 (90 Base) MCG/ACT inhaler, Inhale 2 puffs into the lungs every 6 (six) hours as needed for wheezing or shortness of breath., Disp: 18 g, Rfl: 1 .  aspirin EC 81 MG tablet, Take 1 tablet (81 mg total) by mouth daily. (Patient not taking: Reported on 11/10/2019), Disp: 30 tablet, Rfl: 1 .  benzonatate (TESSALON) 100 MG capsule, Take 1 capsule (100 mg total) by mouth 3 (three) times daily as needed., Disp: 30 capsule, Rfl: 0 .  promethazine-dextromethorphan (PROMETHAZINE-DM) 6.25-15 MG/5ML syrup, Take 5 mLs by mouth 4 (four) times daily as needed., Disp: 118 mL, Rfl: 0  OBJECTIVE: No conversational  dyspnea Age appropriate judgment and insight Nml affect and mood  ASSESSMENT/PLAN: COVID-19 - Plan: benzonatate (TESSALON) 100 MG capsule, promethazine-dextromethorphan (PROMETHAZINE-DM) 6.25-15 MG/5ML syrup, albuterol (VENTOLIN HFA) 108 (90 Base) MCG/ACT inhaler  OK to return to work Friday, letter sent. Discussed 90% of cases resolve after 3 weeks and he is still in acute phase window. Can discuss ICS if still having s/s's after that.  Patient should return for CPE in 2 mo. Total time: 21 min The patient voiced understanding and agreement to the plan.   Jilda Roche [place], DO 11/10/19  3:47 PM

## 2019-11-16 ENCOUNTER — Ambulatory Visit
Admission: EM | Admit: 2019-11-16 | Discharge: 2019-11-16 | Disposition: A | Discharge status: Home / Self Care | Payer: BC Managed Care – PPO | Attending: Physician Assistant | Admitting: Physician Assistant

## 2019-11-16 ENCOUNTER — Other Ambulatory Visit: Payer: Self-pay

## 2019-11-16 ENCOUNTER — Ambulatory Visit (INDEPENDENT_AMBULATORY_CARE_PROVIDER_SITE_OTHER): Payer: BC Managed Care – PPO

## 2019-11-16 DIAGNOSIS — R042 Hemoptysis: Secondary | ICD-10-CM

## 2019-11-16 DIAGNOSIS — J189 Pneumonia, unspecified organism: Secondary | ICD-10-CM

## 2019-11-16 DIAGNOSIS — U071 COVID-19: Secondary | ICD-10-CM

## 2019-11-16 DIAGNOSIS — J209 Acute bronchitis, unspecified: Secondary | ICD-10-CM

## 2019-11-16 MED ORDER — DOXYCYCLINE HYCLATE 100 MG PO CAPS: 100.0000 mg | ORAL_CAPSULE | Freq: Two times a day (BID) | ORAL | 0 refills | Status: DC

## 2019-11-16 MED ORDER — PREDNISONE 50 MG PO TABS: 50.0000 mg | ORAL_TABLET | Freq: Every day | ORAL | 0 refills | Status: DC

## 2019-11-16 MED ORDER — AZITHROMYCIN 250 MG PO TABS: 250.0000 mg | ORAL_TABLET | Freq: Every day | ORAL | 0 refills | Status: DC

## 2019-11-16 NOTE — ED Provider Notes (Signed)
[facility]    CSN: [id number] Arrival date & time: 11/16/19  9562      History   Chief Complaint Chief Complaint  Patient presents with  . Cough    HPI Dale Murillo is a 54 y.o. male.   54 year old male comes in for blood tinged sputum. URI symptoms started 10/26/2019 and was diagnosed with COVID currently with residual cough. Cough improving since COVID.  Cough has been mostly nonproductive, and mostly at night time. This morning, cough included blood tinged sputum and came in for evaluation. Denies fever, shortness of breath, chest pain, rhinorrhea, nasal congestion, sore throat. Never smoker. On albuterol, tessalon, promethazine-DM with good relief of cough.      History reviewed. No pertinent past medical history.  Patient Active Problem List   Diagnosis Date Noted  . Numbness of left hand 10/18/2016  . Left hand weakness 10/18/2016  . TIA (transient ischemic attack) 10/18/2016  . ABNORMAL RESULT, FUNCTION STUDY, KIDNEY 01/03/2007    Past Surgical History:  Procedure Laterality Date  . NO PAST SURGERIES         Home Medications    Prior to Admission medications   Medication Sig Start Date End Date Taking? Authorizing Provider  albuterol (VENTOLIN HFA) 108 (90 Base) MCG/ACT inhaler Inhale 2 puffs into the lungs every 6 (six) hours as needed for wheezing or shortness of breath. 11/10/19   Shelda Pal, DO  aspirin EC 81 MG tablet Take 1 tablet (81 mg total) by mouth daily. Patient not taking: Reported on 11/10/2019 10/18/16   Caren Griffins, MD  benzonatate (TESSALON) 100 MG capsule Take 1 capsule (100 mg total) by mouth 3 (three) times daily as needed. 11/10/19   Shelda Pal, DO  doxycycline (VIBRAMYCIN) 100 MG capsule Take 1 capsule (100 mg total) by mouth 2 (two) times daily. 11/16/19   Tasia Catchings, Aviah Sorci V, PA-C  ibuprofen (ADVIL) 800 MG tablet Take 800 mg by mouth daily.    [provider]  predniSONE (DELTASONE) 50 MG  tablet Take 1 tablet (50 mg total) by mouth daily with breakfast. 11/16/19   Tasia Catchings, Cleona Doubleday V, PA-C  promethazine-dextromethorphan (PROMETHAZINE-DM) 6.25-15 MG/5ML syrup Take 5 mLs by mouth 4 (four) times daily as needed. 11/10/19   Shelda Pal, DO    Family History Family History  Problem Relation Age of Onset  . Stroke Mother   . Stroke Father     Social History Social History   Tobacco Use  . Smoking status: Never Smoker  . Smokeless tobacco: Never Used  Substance Use Topics  . Alcohol use: No  . Drug use: No     Allergies   Patient has no known allergies.   Review of Systems Review of Systems  Reason unable to perform ROS: See HPI as above.     Physical Exam Triage Vital Signs ED Triage Vitals  Enc Vitals Group     BP 11/16/19 0934 127/87     Pulse Rate 11/16/19 0934 96     Resp 11/16/19 0934 20     Temp 11/16/19 0934 98.4 F (36.9 C)     Temp Source 11/16/19 0934 Oral     SpO2 11/16/19 0934 94 %     Weight --      Height --      Head Circumference --      Peak Flow --      Pain Score 11/16/19 0942 0     Pain Loc --  Pain Edu? --      Excl. in GC? --    No data found.  Updated Vital Signs BP 127/87 (BP Location: Right Arm)   Pulse 96   Temp 98.4 F (36.9 C) (Oral)   Resp 20   SpO2 94%   Physical Exam Constitutional:      General: He is not in acute distress.    Appearance: He is well-developed. He is not ill-appearing, toxic-appearing or diaphoretic.  HENT:     Head: Normocephalic and atraumatic.     Right Ear: Tympanic membrane, ear canal and external ear normal. Tympanic membrane is not erythematous or bulging.     Left Ear: Tympanic membrane, ear canal and external ear normal. Tympanic membrane is not erythematous or bulging.     Nose:     Right Sinus: No maxillary sinus tenderness or frontal sinus tenderness.     Left Sinus: No maxillary sinus tenderness or frontal sinus tenderness.     Mouth/Throat:     Mouth: Mucous membranes  are moist.     Pharynx: Oropharynx is clear. Uvula midline. Posterior oropharyngeal erythema present.  Eyes:     Conjunctiva/sclera: Conjunctivae normal.     Pupils: Pupils are equal, round, and reactive to light.  Cardiovascular:     Rate and Rhythm: Normal rate and regular rhythm.  Pulmonary:     Effort: Pulmonary effort is normal. No accessory muscle usage, prolonged expiration, respiratory distress or retractions.     Breath sounds: No decreased air movement or transmitted upper airway sounds. No decreased breath sounds.     Comments: LCTAB Musculoskeletal:        General: No swelling or tenderness.     Cervical back: Normal range of motion and neck supple.     Right lower leg: No edema.     Left lower leg: No edema.  Skin:    General: Skin is warm and dry.  Neurological:     Mental Status: He is alert and oriented to person, place, and time.      UC Treatments / Results  Labs (all labs ordered are listed, but only abnormal results are displayed) Labs Reviewed - No data to display  EKG   Radiology DG Chest 2 View  Result Date: 11/16/2019 CLINICAL DATA:  COVID. Blood-streaked sputum. EXAM: CHEST - 2 VIEW COMPARISON:  None FINDINGS: Normal heart size. No pleural effusion or edema. There is diffusely increased interstitial markings noted bilaterally which may reflect pulmonary vascular congestion or atypical inflammation/infection. No airspace consolidation, or pleural effusion. IMPRESSION: Diffusely increased interstitial markings compatible with pulmonary vascular congestion or atypical inflammation/infection. Electronically Signed   By: Signa Kell M.D.   On: 11/16/2019 10:27    Procedures Procedures (including critical care time)  Medications Ordered in UC Medications - No data to display  Initial Impression / Assessment and Plan / UC Course  I have reviewed the triage vital signs and the nursing notes.  Pertinent labs & imaging results that were available  during my care of the patient were reviewed by me and considered in my medical decision making (see chart for details).    Discussed x-ray results with patient.  Clinically, patient more consistent with atypical inflammation/infection post Covid.  Will start doxycycline, prednisone.  Can continue cough medications/albuterol as needed.  Patient to follow-up with PCP in 1 to 2 weeks for recheck.  Return precautions given.  Patient expresses understanding and agrees to plan.  Final Clinical Impressions(s) / UC Diagnoses  Final diagnoses:  Atypical pneumonia  Acute bronchitis, unspecified organism   ED Prescriptions    Medication Sig Dispense Auth. Provider   predniSONE (DELTASONE) 50 MG tablet  (Status: Discontinued) Take 1 tablet (50 mg total) by mouth daily with breakfast. 5 tablet Danijah Noh V, PA-C   azithromycin (ZITHROMAX) 250 MG tablet  (Status: Discontinued) Take 1 tablet (250 mg total) by mouth daily. Take first 2 tablets together, then 1 every day until finished. 6 tablet Karman Veney V, PA-C   doxycycline (VIBRAMYCIN) 100 MG capsule  (Status: Discontinued) Take 1 capsule (100 mg total) by mouth 2 (two) times daily. 10 capsule Shirelle Tootle V, PA-C   azithromycin (ZITHROMAX) 250 MG tablet  (Status: Discontinued) Take 1 tablet (250 mg total) by mouth daily. Take first 2 tablets together, then 1 every day until finished. 6 tablet Davit Vassar V, PA-C   doxycycline (VIBRAMYCIN) 100 MG capsule Take 1 capsule (100 mg total) by mouth 2 (two) times daily. 10 capsule Kendell Gammon V, PA-C   predniSONE (DELTASONE) 50 MG tablet Take 1 tablet (50 mg total) by mouth daily with breakfast. 5 tablet Belinda Fisher, PA-C     PDMP not reviewed this encounter.   Belinda Fisher, PA-C 11/16/19 1103

## 2019-11-16 NOTE — ED Triage Notes (Signed)
Patient reports having a coughing episode this a.m and noticed a small amount of blood in his mucous.  He states he had COVID x one month ago and a dry cough that has persisted.

## 2019-11-16 NOTE — Discharge Instructions (Signed)
Chest xray result and current history and exam most consistent with post-vial inflammation and atypical infection. Will start doxycycline to cover for atypical infection. Prednisone for inflammation. Albuterol for cough as well. Follow up with PCP in 1-2 weeks for reevaluation.

## 2019-11-19 ENCOUNTER — Telehealth: Payer: Self-pay | Admitting: Family Medicine

## 2019-11-19 NOTE — Telephone Encounter (Signed)
Pt dropped off document to be filled out by provider (Short term disability- 3 pages) Pt would like to be called when document ready to pick up at [phone]. Document put at front office tray under providers name.

## 2019-11-24 NOTE — Telephone Encounter (Signed)
Form is in PCP's folder and will be completed when PCP returns on Wednesday 11/26/19 Called the patient to inform once PCP is back in the office on Wednesday he will complete///the patient wants a call and will pickup at the front desk.

## 2019-11-26 NOTE — Telephone Encounter (Signed)
Paperwork completed///called the patient to pickup at the front desk///made a copy for scan

## 2019-11-26 NOTE — Telephone Encounter (Signed)
Patient called in today to inquire about short term disability form. Patient states that he needs form completed today by 4pm.

## 2019-11-27 ENCOUNTER — Other Ambulatory Visit: Payer: Self-pay | Admitting: Family Medicine

## 2019-11-27 DIAGNOSIS — U071 COVID-19: Secondary | ICD-10-CM

## 2019-11-27 NOTE — Telephone Encounter (Signed)
Medication: promethazine-dextromethorphan (PROMETHAZINE-DM) 6.25-15 MG/5ML syrup      Has the patient contacted their pharmacy?  (If no, request that the patient contact the pharmacy for the refill.) (If yes, when and what did the pharmacy advise?)     Preferred Pharmacy (with phone number or street name): [id number] - [place], [place] - [address]  [address] [place] [address]  Phone:  [phone] Fax:  [phone]     Agent: Please be advised that RX refills may take up to 3 business days. We ask that you follow-up with your pharmacy.

## 2019-11-28 MED ORDER — PROMETHAZINE-DM 6.25-15 MG/5ML PO SYRP: 5.0000 mL | ORAL_SOLUTION | Freq: Four times a day (QID) | ORAL | 0 refills | Status: DC | PRN

## 2019-12-10 ENCOUNTER — Encounter: Payer: Self-pay | Admitting: Family Medicine

## 2019-12-10 ENCOUNTER — Other Ambulatory Visit: Payer: Self-pay

## 2019-12-10 ENCOUNTER — Ambulatory Visit: Payer: BC Managed Care – PPO | Admitting: Family Medicine

## 2019-12-10 VITALS — BP 122/76 | HR 97 | Temp 96.2°F | Ht 74.0 in | Wt 228.0 lb

## 2019-12-10 DIAGNOSIS — Z0000 Encounter for general adult medical examination without abnormal findings: Secondary | ICD-10-CM | POA: Diagnosis not present

## 2019-12-10 DIAGNOSIS — Z125 Encounter for screening for malignant neoplasm of prostate: Secondary | ICD-10-CM | POA: Diagnosis not present

## 2019-12-10 LAB — COMPREHENSIVE METABOLIC PANEL WITH GFR
ALT: 19 U/L (ref 0–53)
AST: 27 U/L (ref 0–37)
Albumin: 4.1 g/dL (ref 3.5–5.2)
Alkaline Phosphatase: 65 U/L (ref 39–117)
BUN: 11 mg/dL (ref 6–23)
CO2: 30 meq/L (ref 19–32)
Calcium: 9.2 mg/dL (ref 8.4–10.5)
Chloride: 104 meq/L (ref 96–112)
Creatinine, Ser: 1.28 mg/dL (ref 0.40–1.50)
GFR: 70.96 mL/min
Glucose, Bld: 110 mg/dL — ABNORMAL HIGH (ref 70–99)
Potassium: 4.3 meq/L (ref 3.5–5.1)
Sodium: 138 meq/L (ref 135–145)
Total Bilirubin: 0.6 mg/dL (ref 0.2–1.2)
Total Protein: 7 g/dL (ref 6.0–8.3)

## 2019-12-10 LAB — LIPID PANEL
Cholesterol: 180 mg/dL (ref 0–200)
HDL: 49.2 mg/dL
LDL Cholesterol: 112 mg/dL — ABNORMAL HIGH (ref 0–99)
NonHDL: 130.91
Total CHOL/HDL Ratio: 4
Triglycerides: 95 mg/dL (ref 0.0–149.0)
VLDL: 19 mg/dL (ref 0.0–40.0)

## 2019-12-10 LAB — CBC
HCT: 43 % (ref 39.0–52.0)
Hemoglobin: 13.7 g/dL (ref 13.0–17.0)
MCHC: 31.9 g/dL (ref 30.0–36.0)
MCV: 83.6 fl (ref 78.0–100.0)
Platelets: 231 10*3/uL (ref 150.0–400.0)
RBC: 5.15 Mil/uL (ref 4.22–5.81)
RDW: 16 % — ABNORMAL HIGH (ref 11.5–15.5)
WBC: 5.9 10*3/uL (ref 4.0–10.5)

## 2019-12-10 LAB — PSA: PSA: 1.51 ng/mL (ref 0.10–4.00)

## 2019-12-10 NOTE — Patient Instructions (Addendum)
Give [place] 2-3 business days to get the results of your labs back.   Keep the diet clean and stay active.  OK to use Debrox (peroxide) in the ear to loosen up wax. Also recommend using a bulb syringe (for removing boogers from baby's noses) to flush through warm water. Do not use Q-tips as this can impact wax further. I would wait at least 2 weeks after the second covid shot prior to getting.   Let us know if you need anything.

## 2019-12-10 NOTE — Progress Notes (Signed)
Chief Complaint  Patient presents with  . Follow-up    UC/pneumonia    Well Male Dale Murillo is here for a complete physical.   His last physical was >1 year ago.  Current diet: in general, a "healthy" diet.  Current exercise: walking Weight trend: stable Fatigue? No. Seat belt? Yes.    Health maintenance Shingrix- Yes Colonoscopy- Yes Tetanus- Yes HIV- Yes Hep C- Yes   Past Medical History:  Diagnosis Date  . No known health problems        Past Surgical History:  Procedure Laterality Date  . NO PAST SURGERIES      Medications  Takes no meds routinely.   Allergies No Known Allergies  Family History Family History  Problem Relation Age of Onset  . Stroke Mother   . Stroke Father   . Cancer Neg Hx     Review of Systems: Constitutional:  no fevers Eye:  no recent significant change in vision Ear/Nose/Mouth/Throat:  Ears:  no hearing loss Nose/Mouth/Throat:  no complaints of nasal congestion, no sore throat Cardiovascular:  no chest pain, no palpitations Respiratory:  no cough and no shortness of breath Gastrointestinal:  no abdominal pain, no change in bowel habits GU:  Male: negative for dysuria, frequency, and incontinence and negative for prostate symptoms Musculoskeletal/Extremities:  no pain, redness, or swelling of the joints Integumentary (Skin/Breast):  no abnormal skin lesions reported Neurologic:  no headaches Endocrine: No unexpected weight changes Hematologic/Lymphatic:  no abnormal bleeding  Exam BP 122/76 (BP Location: Left Arm, Patient Position: Sitting, Cuff Size: Normal)   Pulse 97   Temp (!) 96.2 F (35.7 C) (Temporal)   Ht 6\' 2"  (1.88 m)   Wt 228 lb (103.4 kg)   SpO2 98%   BMI 29.27 kg/m  General:  well developed, well nourished, in no apparent distress Skin:  no significant moles, warts, or growths Head:  no masses, lesions, or tenderness Eyes:  pupils equal and round, sclera anicteric without injection Ears:  canals  without lesions, TMs shiny without retraction, no obvious effusion, no erythema Nose:  nares patent, septum midline, mucosa normal Throat/Pharynx:  lips and gingiva without lesion; tongue and uvula midline; non-inflamed pharynx; no exudates or postnasal drainage Neck: neck supple without adenopathy, thyromegaly, or masses Cardiac: RRR, no bruits, no LE edema Lungs:  clear to auscultation, breath sounds equal bilaterally, no respiratory distress Rectal: Deferred Musculoskeletal:  symmetrical muscle groups noted without atrophy or deformity Neuro:  gait normal; deep tendon reflexes normal and symmetric Psych: well oriented with normal range of affect and appropriate judgment/insight  Assessment and Plan  Well adult exam - Plan: CBC, Comprehensive metabolic panel, Lipid panel  Screening for prostate cancer - Plan: PSA   Well 54 y.o. male. Counseled on diet and exercise. Counseled on risks and benefits of prostate cancer screening with PSA. The patient agrees to undergo testing. Immunizations, labs, and further orders as above. Follow up in 1 yr or prn. The patient voiced understanding and agreement to the plan.  [address] [place], DO 12/10/19 11:40 AM

## 2020-04-28 ENCOUNTER — Other Ambulatory Visit: Payer: Self-pay

## 2020-04-28 ENCOUNTER — Ambulatory Visit: Payer: BC Managed Care – PPO | Admitting: Family Medicine

## 2020-04-28 ENCOUNTER — Encounter: Payer: Self-pay | Admitting: Family Medicine

## 2020-04-28 VITALS — BP 120/90 | HR 86 | Temp 98.2°F | Resp 18 | Ht 74.0 in | Wt 231.4 lb

## 2020-04-28 DIAGNOSIS — M545 Low back pain, unspecified: Secondary | ICD-10-CM | POA: Diagnosis not present

## 2020-04-28 NOTE — Progress Notes (Signed)
Musculoskeletal Exam  Patient: Dale Murillo DOB: December 17, 1965  DOS: 04/28/2020  SUBJECTIVE:  Chief Complaint:   Chief Complaint  Patient presents with  . Flank Pain    left, x2 weeks, Pt states no nausea, vomiting, or diarrhea. Pt states no urinary sxs.  Pt states no known injury but working out at [employer] and doing some work at home.     [name] is a 54 y.o.  male for evaluation and treatment of L side pain.   Onset:  2 weeks ago. No inj or change in activity.  Location:  Character:  aching and sharp  Progression of issue:  is unchanged Associated symptoms: No bruising, swelling, redness Treatment: to date has been home exercises.   Neurovascular symptoms: no Hx of liver cyst removal and aspiration of 2 L of fluid, wondering if it is related to that. This was in 2020, nml labs in 12/2019.  Past Medical History:  Diagnosis Date  . No known health problems     Objective: VITAL SIGNS: BP 120/90 (BP Location: Right Arm, Patient Position: Sitting, Cuff Size: Large)   Pulse 86   Temp 98.2 F (36.8 C) (Oral)   Resp 18   Ht 6\' 2"  (1.88 m)   Wt 231 lb 6.4 oz (105 kg)   SpO2 97%   BMI 29.71 kg/m  Constitutional: Well formed, well developed. No acute distress. Abd: BS+, S, NT, ND Thorax & Lungs: No accessory muscle use Musculoskeletal: L side.   Tenderness to palpation: yes over ES group on L near L 3-5 Deformity: no Ecchymosis: no Tests positive: None Tests negative: straight leg Neurologic: Normal sensory function. Gait nml.  Psychiatric: Normal mood. Age appropriate judgment and insight. Alert & oriented x 3.    Assessment:  Acute left-sided low back pain without sciatica  Plan: Stretches/exercises, heat, ice, Tylenol.  F/u prn. The patient voiced understanding and agreement to the plan.   [place], DO 04/28/20  3:05 PM

## 2020-04-28 NOTE — Patient Instructions (Addendum)

## 2020-05-26 IMAGING — DX DG CHEST 2V
2 series · 2 of 2 positions shown · non-contrast
Comparison: None

CLINICAL DATA: COVID. Blood-streaked sputum.

EXAM:
CHEST - 2 VIEW

[chest pa]
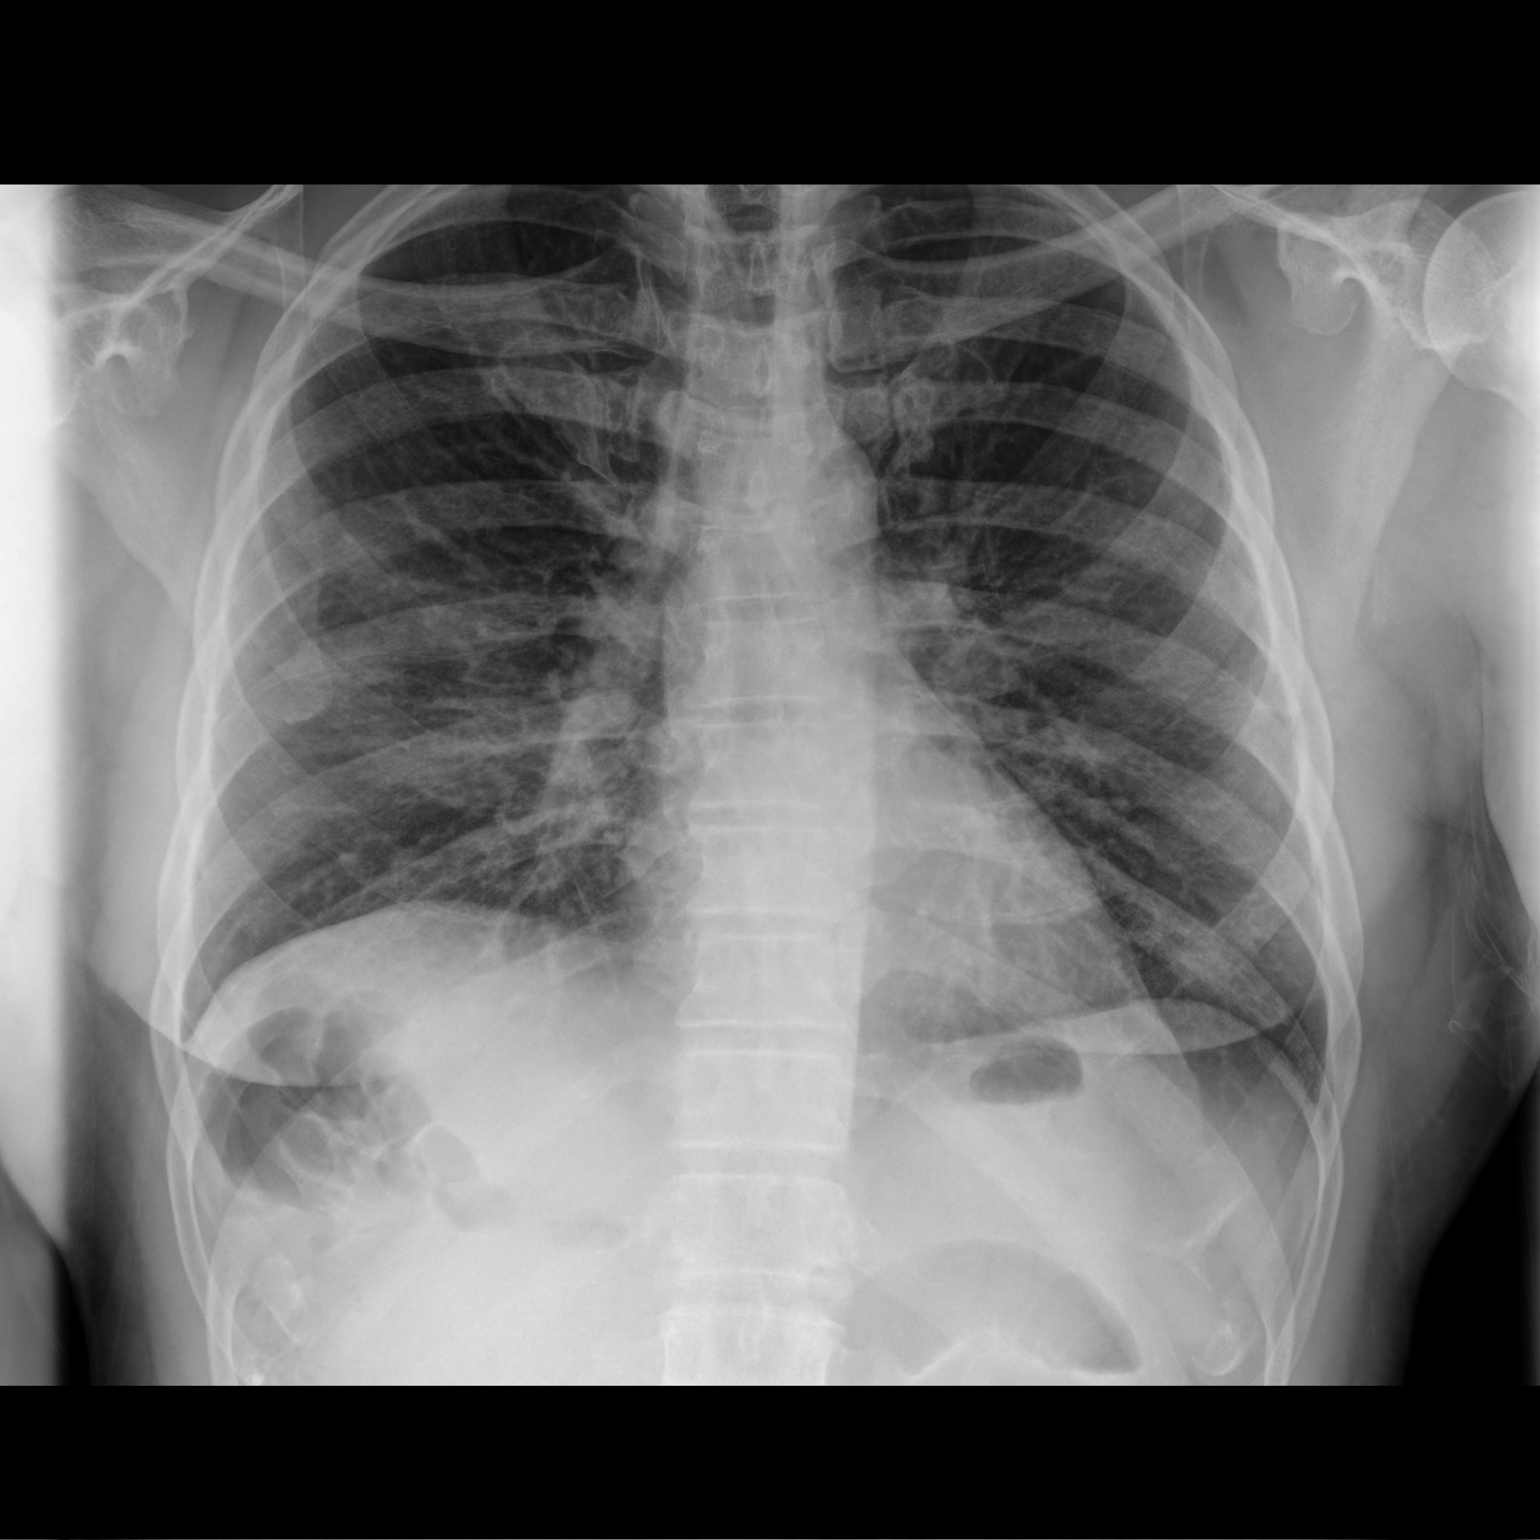

[chest lat]
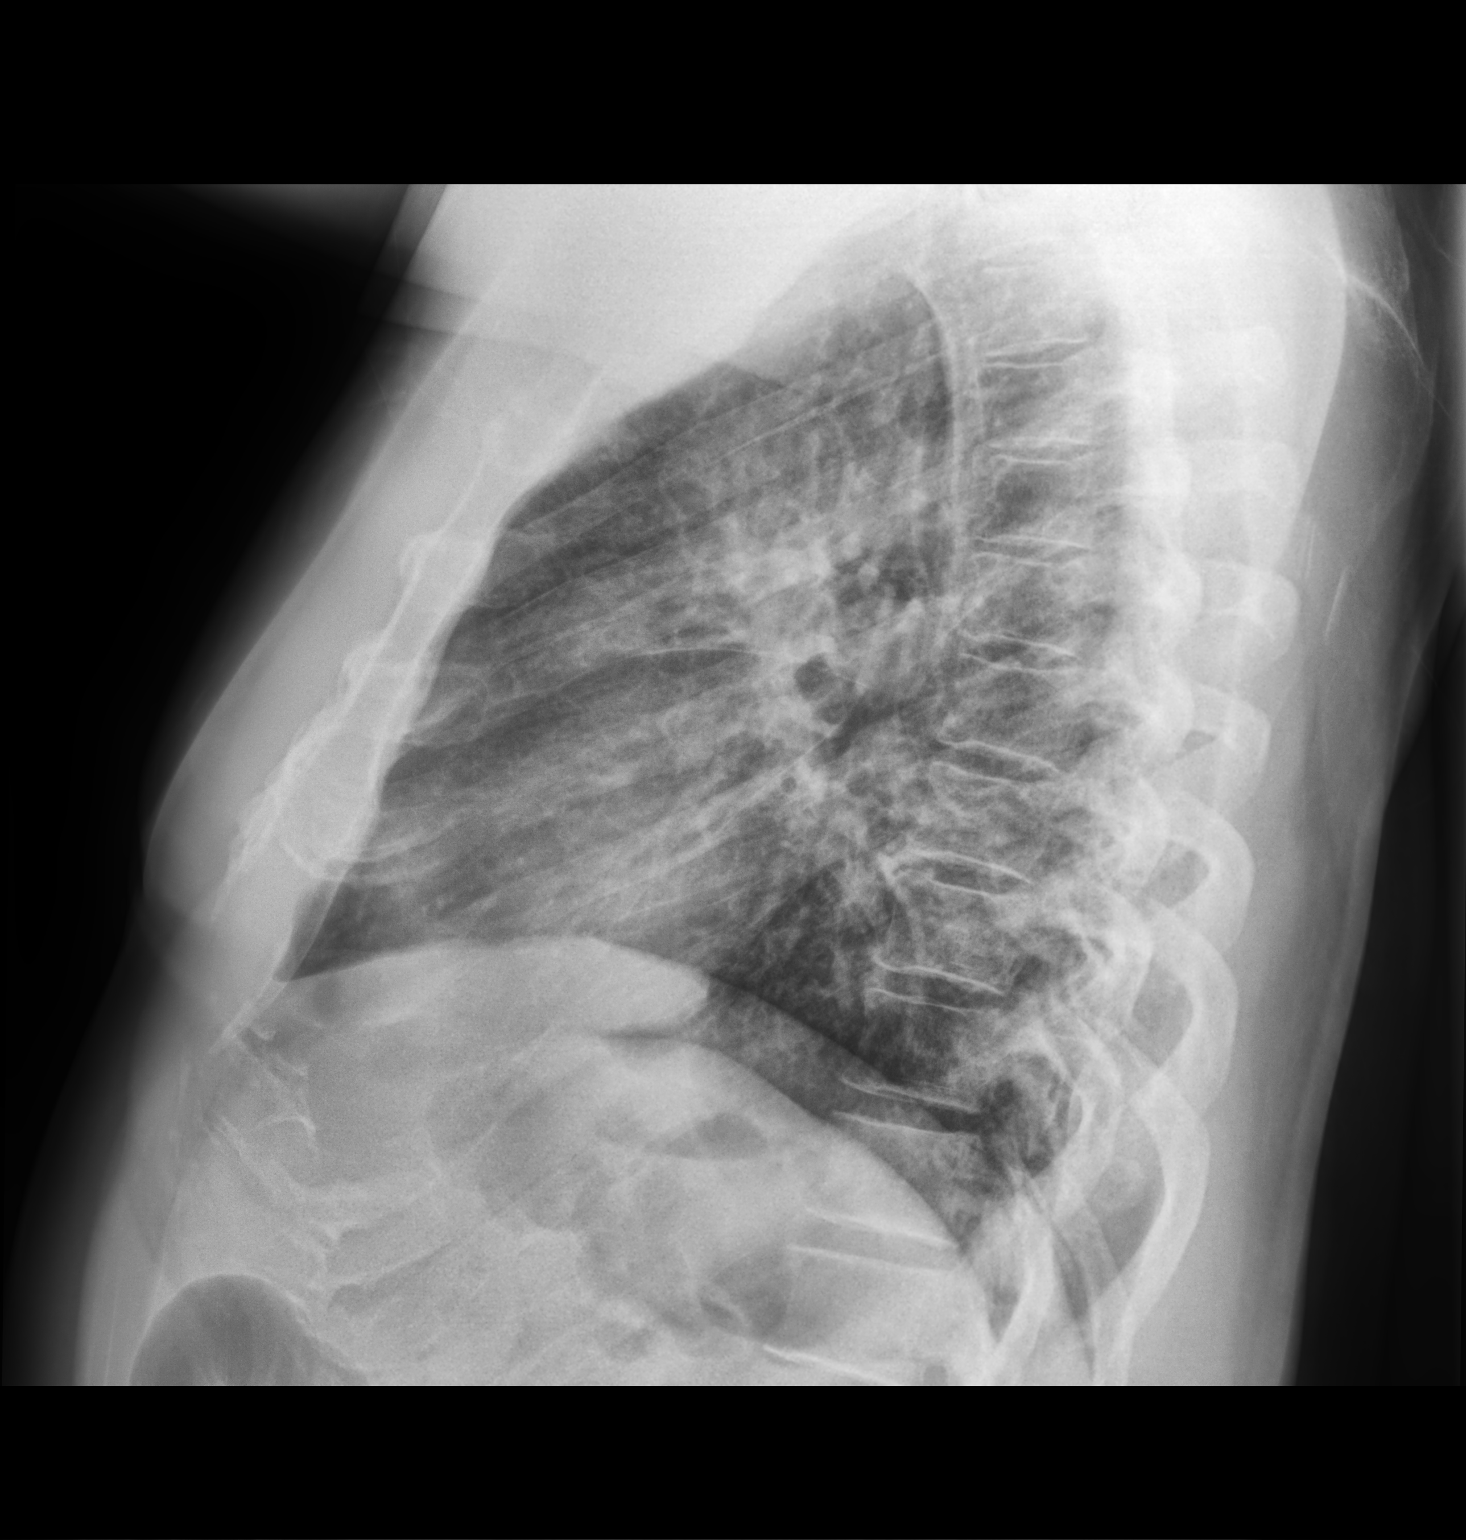

[2 of 2 positions shown; findings below may reference images not displayed]

FINDINGS: Normal heart size. No pleural effusion or edema. There is diffusely
increased interstitial markings noted bilaterally which may reflect
pulmonary vascular congestion or atypical inflammation/infection. No
airspace consolidation, or pleural effusion.
IMPRESSION: Diffusely increased interstitial markings compatible with pulmonary
vascular congestion or atypical inflammation/infection.

## 2020-07-01 DIAGNOSIS — M13872 Other specified arthritis, left ankle and foot: Secondary | ICD-10-CM | POA: Diagnosis not present

## 2020-08-25 ENCOUNTER — Ambulatory Visit: Payer: BC Managed Care – PPO | Admitting: Family Medicine

## 2020-08-25 ENCOUNTER — Other Ambulatory Visit: Payer: Self-pay

## 2020-08-25 ENCOUNTER — Encounter: Payer: Self-pay | Admitting: Family Medicine

## 2020-08-25 VITALS — BP 132/82 | HR 76 | Temp 98.6°F | Ht 74.0 in | Wt 240.2 lb

## 2020-08-25 DIAGNOSIS — M79674 Pain in right toe(s): Secondary | ICD-10-CM | POA: Diagnosis not present

## 2020-08-25 DIAGNOSIS — M25572 Pain in left ankle and joints of left foot: Secondary | ICD-10-CM | POA: Diagnosis not present

## 2020-08-25 LAB — BASIC METABOLIC PANEL
BUN: 15 mg/dL (ref 6–23)
CO2: 31 mEq/L (ref 19–32)
Calcium: 10 mg/dL (ref 8.4–10.5)
Chloride: 100 mEq/L (ref 96–112)
Creatinine, Ser: 1.39 mg/dL (ref 0.40–1.50)
GFR: 57.53 mL/min — ABNORMAL LOW (ref >60.00)
Glucose, Bld: 94 mg/dL (ref 70–99)
Potassium: 4.8 mEq/L (ref 3.5–5.1)
Sodium: 137 mEq/L (ref 135–145)

## 2020-08-25 LAB — URIC ACID: Uric Acid, Serum: 7.3 mg/dL (ref 4.0–7.8)

## 2020-08-25 MED ORDER — PREDNISONE 20 MG PO TABS
40.0000 mg | ORAL_TABLET | Freq: Every day | ORAL | 0 refills | Status: DC
Start: 2020-08-25 — End: 2020-08-30

## 2020-08-25 NOTE — Patient Instructions (Signed)
Give [place] 2-3 business days to get the results of your labs back.   OK to take Tylenol 1000 mg (2 extra strength tabs) or 975 mg (3 regular strength tabs) every 6 hours as needed.  Ice/cold pack over area for 10-15 min twice daily.  Foods to AVOID: Red meat, organ meat (liver), lunch meat, seafood (mussels, scallops, anchovies, etc) Alcohol Sugary foods/beverages (diet soft drinks have no link to flares)  Foods to migrate to: Dairy Vegetables Cherries have limited data to suggest they help lower uric acid levels (and prevent flares) Vit C (500 mg daily) may have a modest effect with preventing flares Poultry If you are going to eat red meat, beef and pork may give you less problems than lamb.  Let us know if you need anything.

## 2020-08-25 NOTE — Progress Notes (Signed)
Musculoskeletal Exam  Patient: Dale Murillo DOB: [birth year]  DOS: 08/25/2020  SUBJECTIVE:  Chief Complaint:   Chief Complaint  Patient presents with   Foot Pain    Right big toe and left heel    Dale Murillo is a 55 y.o.  male for evaluation and treatment of bl foot pain.  He is here with his wife.  Onset:  2 months ago. No inj or change in activity.  Location: R foot and left ankle Character:  aching  Progression of issue:  has worsened Eating seafood and beef made it worse. He tried wearing a boot that did not help.  X-rays with Ortho were unremarkable and he was diagnosed with arthritis. Associated symptoms: swelling, warmth, limping; no fevers, redness, bruising Treatment: to date has been ice, OTC NSAIDS and cherry tart extract.   Neurovascular symptoms: no  Past Medical History:  Diagnosis Date   No known health problems     Objective: VITAL SIGNS: BP 132/82 (BP Location: Left Arm, Patient Position: Sitting, Cuff Size: Large)    Pulse 76    Temp 98.6 F (37 C) (Oral)    Ht 6\' 2"  (1.88 m)    Wt 240 lb 4 oz (109 kg)    SpO2 99%    BMI 30.85 kg/m  Constitutional: Well formed, well developed. No acute distress. Thorax & Lungs: No accessory muscle use Musculoskeletal: Bilateral feet.   Tenderness to palpation: Yes over the right first MTP, mild tenderness over the left ankle Deformity: no Ecchymosis: no Warmth noted to be greater over the right MTP compared to the rest of the surrounding tissue Swelling noted at the left ankle in addition to the dorsum of the right forefoot Tests positive: none Tests negative: Anterior drawer, squeeze Neurologic: Normal sensory function.  Psychiatric: Normal mood. Age appropriate judgment and insight. Alert & oriented x 3.    Assessment:  Left ankle pain, unspecified chronicity - Plan: Uric acid, Basic metabolic panel  Toe pain, right - Plan: Uric acid, Basic metabolic panel  Plan: 7-day prednisone burst, 40 mg daily,  ice, Tylenol.  Check uric acid and kidney function.  This is likely to be gout.  Foods that are helpful in gout discussed and written down the paperwork. F/u pending results. The patient and his spouse voiced understanding and agreement to the plan.   [place], DO 08/25/20  10:06 AM

## 2020-08-30 ENCOUNTER — Other Ambulatory Visit: Payer: Self-pay

## 2020-08-30 MED ORDER — PREDNISONE 20 MG PO TABS: 40.0000 mg | ORAL_TABLET | Freq: Every day | ORAL | 0 refills | Status: AC

## 2020-09-15 ENCOUNTER — Other Ambulatory Visit: Payer: Self-pay

## 2020-09-15 ENCOUNTER — Encounter: Payer: Self-pay | Admitting: Family Medicine

## 2020-09-15 ENCOUNTER — Ambulatory Visit: Payer: BC Managed Care – PPO | Admitting: Family Medicine

## 2020-09-15 VITALS — BP 132/80 | HR 97 | Temp 98.4°F | Resp 17 | Ht 74.0 in | Wt 235.0 lb

## 2020-09-15 DIAGNOSIS — M79674 Pain in right toe(s): Secondary | ICD-10-CM

## 2020-09-15 DIAGNOSIS — M1A9XX0 Chronic gout, unspecified, without tophus (tophi): Secondary | ICD-10-CM

## 2020-09-15 DIAGNOSIS — M25572 Pain in left ankle and joints of left foot: Secondary | ICD-10-CM | POA: Diagnosis not present

## 2020-09-15 MED ORDER — ALLOPURINOL 100 MG PO TABS: 100.0000 mg | ORAL_TABLET | Freq: Every day | ORAL | 6 refills | Status: AC

## 2020-09-15 MED ORDER — PREDNISONE 20 MG PO TABS: 40.0000 mg | ORAL_TABLET | Freq: Every day | ORAL | 0 refills | Status: DC

## 2020-09-15 NOTE — Progress Notes (Signed)
Musculoskeletal Exam  Patient: Dale Murillo DOB: [birth year]  DOS: 09/15/2020  SUBJECTIVE:  Chief Complaint:   Chief Complaint  Patient presents with  . Foot Pain    Left foot medial side swelling, soreness, twisted ankle last week     Dale Murillo is a 55 y.o.  male for evaluation and treatment of L ankle pain.   Onset:  6 days ago. Helped sister in law move, no specific injr seemed to contribute  Location: inside of L ankle, R 1st MTP Character:  aching  Progression of issue:  is unchanged Associated symptoms: swelling, no bruising or redness Treatment: to date has been ice, OTC NSAIDS and elevation.    Neurovascular symptoms: no Urate was 7.3.   Past Medical History:  Diagnosis Date  . No known health problems     Objective: VITAL SIGNS: BP 132/80 (BP Location: Left Arm, Patient Position: Sitting, Cuff Size: Normal)   Pulse 97   Temp 98.4 F (36.9 C)   Resp 17   Ht 6\' 2"  (1.88 m)   Wt 235 lb (106.6 kg)   SpO2 98%   BMI 30.17 kg/m  Constitutional: Well formed, well developed. No acute distress. Thorax & Lungs: No accessory muscle use Musculoskeletal: b/l feet.   Normal active range of motion: yes.   Normal passive range of motion: yes Tenderness to palpation: yes over L ankle jt and R 1st MTP Deformity: no Ecchymosis: no +warmth and swelling Tests positive: none Tests negative: squeeze and anterior drawer Neurologic: Normal sensory function.  Psychiatric: Normal mood. Age appropriate judgment and insight. Alert & oriented x 3.    Assessment:  Left ankle pain, unspecified chronicity - Plan: predniSONE (DELTASONE) 20 MG tablet  Toe pain, right - Plan: predniSONE (DELTASONE) 20 MG tablet  Chronic gout involving toe of right foot without tophus, unspecified cause - Plan: allopurinol (ZYLOPRIM) 100 MG tablet  Plan: 7 d pred burst 40 mg/d, ice, Tylenol.  Start allopurinol, low purine diet provided.  F/u as originally scheduled for CPE. The patient  voiced understanding and agreement to the plan.   [place], DO 09/15/20  8:23 AM

## 2020-09-15 NOTE — Patient Instructions (Addendum)
Foods to AVOID: Red meat, organ meat (liver), lunch meat, seafood (mussels, scallops, anchovies, etc) Alcohol Sugary foods/beverages (diet soft drinks have no link to flares)  Foods to migrate to: Dairy Vegetables Cherries have limited data to suggest they help lower uric acid levels (and prevent flares) Vit C (500 mg daily) may have a modest effect with preventing flares Poultry If you are going to eat red meat, beef and pork may give you less problems than lamb.  Ice/cold pack over area for 10-15 min twice daily.  Let us know if you need anything.

## 2020-10-04 ENCOUNTER — Other Ambulatory Visit: Payer: Self-pay | Admitting: Family Medicine

## 2020-10-04 DIAGNOSIS — M79674 Pain in right toe(s): Secondary | ICD-10-CM

## 2020-10-04 DIAGNOSIS — M25572 Pain in left ankle and joints of left foot: Secondary | ICD-10-CM

## 2020-10-20 ENCOUNTER — Other Ambulatory Visit: Payer: Self-pay | Admitting: Family Medicine

## 2020-10-20 DIAGNOSIS — M79674 Pain in right toe(s): Secondary | ICD-10-CM

## 2020-10-20 DIAGNOSIS — M25572 Pain in left ankle and joints of left foot: Secondary | ICD-10-CM

## 2020-10-26 ENCOUNTER — Telehealth: Payer: Self-pay | Admitting: Family Medicine

## 2020-10-26 NOTE — Telephone Encounter (Signed)
Caller: Darlene  Call Back @ [phone]  Dale Murillo  -[facility]    Patient's wife called in reference to pathologist, per wife pathologist would like Dr Carmelia Roller to contact him tomorrow morning on his cell number @ [phone]; Dale Murillo

## 2020-10-26 NOTE — Telephone Encounter (Signed)
Wife called stating patient has passed and would like for you to know they will be performing an autopsy and the pathology will be calling tomorrow to give info for death certificate.

## 2020-10-27 NOTE — Telephone Encounter (Signed)
[name] called and need to put on Bilateral Pulmonary embolism-was on both sides. Can also put Acute Bilateral Pulmonary embolism.

## 2020-10-27 NOTE — Telephone Encounter (Signed)
Called [name] and he will inform us of a formal diagnosis to put on death certificate. I gave him our office number and told him to ask for Zella Ball to relay the message of his findings.

## 2020-10-31 DEATH — deceased

## 2020-11-03 ENCOUNTER — Encounter: Payer: BC Managed Care – PPO | Admitting: Family Medicine

## 2020-11-03 NOTE — Telephone Encounter (Signed)
Spoke to his wife and he died at home and was at 7:50 AM ..Marland KitchenMarland Kitchen

## 2020-11-03 NOTE — Telephone Encounter (Signed)
Caller: Darlene  Call Back @ [phone]  Patient's wife called in reference to death certificate, she would like the certificate to be completed today if possible. It's in [facility]   Please advise

## 2020-11-03 NOTE — Telephone Encounter (Signed)
Called Vernona Rieger at the funeral home [phone] to inform death certificate completed.

## 2020-11-03 NOTE — Telephone Encounter (Signed)
Also received call from the [facility] from [place] at [phone] Patient #[id number] in [place]

## 2020-11-03 NOTE — Telephone Encounter (Signed)
Date is Monday October 25, 2020

## 2020-12-15 ENCOUNTER — Encounter: Payer: BC Managed Care – PPO | Admitting: Family Medicine
